# Patient Record
Sex: Female | Born: 1970 | Race: White | Hispanic: No | Marital: Single | Smoking: Former smoker
Health system: Southern US, Community
[De-identification: ages and names within clinical notes are randomized; demographics above are authoritative.]

## PROBLEM LIST (undated history)

## (undated) DIAGNOSIS — I429 Cardiomyopathy, unspecified: Secondary | ICD-10-CM

## (undated) DIAGNOSIS — G43909 Migraine, unspecified, not intractable, without status migrainosus: Secondary | ICD-10-CM

---

## 2000-03-18 ENCOUNTER — Other Ambulatory Visit: Admission: RE | Admit: 2000-03-18 | Discharge: 2000-03-18 | Payer: Self-pay | Admitting: Obstetrics & Gynecology

## 2000-09-07 ENCOUNTER — Inpatient Hospital Stay (HOSPITAL_COMMUNITY): Admission: AD | Admit: 2000-09-07 | Discharge: 2000-09-07 | Payer: Self-pay | Admitting: Obstetrics & Gynecology

## 2000-09-07 ENCOUNTER — Encounter: Payer: Self-pay | Admitting: Obstetrics and Gynecology

## 2000-11-15 ENCOUNTER — Inpatient Hospital Stay (HOSPITAL_COMMUNITY): Admission: AD | Admit: 2000-11-15 | Discharge: 2000-11-18 | Payer: Self-pay | Admitting: Obstetrics & Gynecology

## 2000-11-19 ENCOUNTER — Encounter: Admission: RE | Admit: 2000-11-19 | Discharge: 2000-11-29 | Payer: Self-pay | Admitting: Obstetrics & Gynecology

## 2001-04-06 ENCOUNTER — Encounter: Payer: Self-pay | Admitting: Family Medicine

## 2001-04-06 ENCOUNTER — Encounter: Admission: RE | Admit: 2001-04-06 | Discharge: 2001-04-06 | Payer: Self-pay | Admitting: Family Medicine

## 2002-07-05 ENCOUNTER — Other Ambulatory Visit: Admission: RE | Admit: 2002-07-05 | Discharge: 2002-07-05 | Payer: Self-pay | Admitting: Obstetrics & Gynecology

## 2003-08-22 ENCOUNTER — Other Ambulatory Visit: Admission: RE | Admit: 2003-08-22 | Discharge: 2003-08-22 | Payer: Self-pay | Admitting: Obstetrics & Gynecology

## 2004-10-03 ENCOUNTER — Other Ambulatory Visit: Admission: RE | Admit: 2004-10-03 | Discharge: 2004-10-03 | Payer: Self-pay | Admitting: Obstetrics & Gynecology

## 2005-12-09 ENCOUNTER — Other Ambulatory Visit: Admission: RE | Admit: 2005-12-09 | Discharge: 2005-12-09 | Payer: Self-pay | Admitting: Obstetrics and Gynecology

## 2007-01-16 ENCOUNTER — Emergency Department (HOSPITAL_COMMUNITY): Admission: EM | Admit: 2007-01-16 | Discharge: 2007-01-16 | Payer: Self-pay | Admitting: Emergency Medicine

## 2012-05-31 ENCOUNTER — Ambulatory Visit: Payer: Self-pay | Admitting: Neurology

## 2012-11-29 ENCOUNTER — Emergency Department (HOSPITAL_COMMUNITY): Payer: Medicaid Other

## 2012-11-29 ENCOUNTER — Emergency Department (HOSPITAL_COMMUNITY)
Admission: EM | Admit: 2012-11-29 | Discharge: 2012-11-29 | Disposition: A | Discharge status: Home / Self Care | Payer: Medicaid Other | Attending: Emergency Medicine | Admitting: Emergency Medicine

## 2012-11-29 ENCOUNTER — Encounter (HOSPITAL_COMMUNITY): Payer: Self-pay | Admitting: *Deleted

## 2012-11-29 DIAGNOSIS — R112 Nausea with vomiting, unspecified: Secondary | ICD-10-CM | POA: Insufficient documentation

## 2012-11-29 DIAGNOSIS — N200 Calculus of kidney: Secondary | ICD-10-CM | POA: Insufficient documentation

## 2012-11-29 DIAGNOSIS — G43909 Migraine, unspecified, not intractable, without status migrainosus: Secondary | ICD-10-CM | POA: Insufficient documentation

## 2012-11-29 DIAGNOSIS — Z79899 Other long term (current) drug therapy: Secondary | ICD-10-CM | POA: Insufficient documentation

## 2012-11-29 DIAGNOSIS — Z3202 Encounter for pregnancy test, result negative: Secondary | ICD-10-CM | POA: Insufficient documentation

## 2012-11-29 DIAGNOSIS — R1032 Left lower quadrant pain: Secondary | ICD-10-CM | POA: Insufficient documentation

## 2012-11-29 DIAGNOSIS — R109 Unspecified abdominal pain: Secondary | ICD-10-CM

## 2012-11-29 HISTORY — DX: Migraine, unspecified, not intractable, without status migrainosus: G43.909

## 2012-11-29 LAB — BASIC METABOLIC PANEL
BUN: 11 mg/dL (ref 6–23)
CO2: 21 mEq/L (ref 19–32)
Chloride: 103 mEq/L (ref 96–112)
GFR calc Af Amer: >90 mL/min (ref >90)
Potassium: 4 mEq/L (ref 3.5–5.1)

## 2012-11-29 LAB — CBC WITH DIFFERENTIAL/PLATELET
Basophils Relative: 0 % (ref 0–1)
HCT: 44.1 % (ref 36.0–46.0)
Hemoglobin: 15.5 g/dL — ABNORMAL HIGH (ref 12.0–15.0)
Lymphocytes Relative: 12 % (ref 12–46)
Lymphs Abs: 2 10*3/uL (ref 0.7–4.0)
MCHC: 35.1 g/dL (ref 30.0–36.0)
Monocytes Absolute: 0.8 10*3/uL (ref 0.1–1.0)
Monocytes Relative: 5 % (ref 3–12)
Neutro Abs: 13 10*3/uL — ABNORMAL HIGH (ref 1.7–7.7)
Neutrophils Relative %: 81 % — ABNORMAL HIGH (ref 43–77)
RBC: 5.13 MIL/uL — ABNORMAL HIGH (ref 3.87–5.11)
WBC: 16.1 10*3/uL — ABNORMAL HIGH (ref 4.0–10.5)

## 2012-11-29 LAB — URINALYSIS, ROUTINE W REFLEX MICROSCOPIC
Bilirubin Urine: NEGATIVE
Glucose, UA: NEGATIVE mg/dL
Leukocytes, UA: NEGATIVE
Nitrite: NEGATIVE
Specific Gravity, Urine: 1.015 (ref 1.005–1.030)
pH: 5 (ref 5.0–8.0)

## 2012-11-29 LAB — URINE MICROSCOPIC-ADD ON

## 2012-11-29 MED ORDER — KETOROLAC TROMETHAMINE 30 MG/ML IJ SOLN
30.0000 mg | Freq: Once | INTRAMUSCULAR | Status: AC
  Administered 2012-11-29: 30 mg via INTRAVENOUS
  Filled 2012-11-29: qty 1

## 2012-11-29 MED ORDER — IOHEXOL 300 MG/ML  SOLN
100.0000 mL | Freq: Once | INTRAMUSCULAR | Status: AC | PRN
  Administered 2012-11-29: 100 mL via INTRAVENOUS

## 2012-11-29 MED ORDER — SODIUM CHLORIDE 0.9 % IV BOLUS (SEPSIS)
1000.0000 mL | Freq: Once | INTRAVENOUS | Status: AC
  Administered 2012-11-29: 1000 mL via INTRAVENOUS

## 2012-11-29 MED ORDER — MORPHINE SULFATE 4 MG/ML IJ SOLN
8.0000 mg | Freq: Once | INTRAMUSCULAR | Status: AC
  Administered 2012-11-29: 8 mg via INTRAVENOUS
  Filled 2012-11-29: qty 2

## 2012-11-29 MED ORDER — ONDANSETRON 8 MG PO TBDP: 8.0000 mg | ORAL_TABLET | Freq: Three times a day (TID) | ORAL | Status: DC | PRN

## 2012-11-29 MED ORDER — OXYCODONE-ACETAMINOPHEN 5-325 MG PO TABS: 1.0000 | ORAL_TABLET | ORAL | Status: DC | PRN

## 2012-11-29 MED ORDER — IBUPROFEN 800 MG PO TABS: 800.0000 mg | ORAL_TABLET | Freq: Three times a day (TID) | ORAL | Status: DC

## 2012-11-29 MED ORDER — TAMSULOSIN HCL 0.4 MG PO CAPS: 0.4000 mg | ORAL_CAPSULE | Freq: Every day | ORAL | Status: DC

## 2012-11-29 MED ORDER — ONDANSETRON HCL 4 MG/2ML IJ SOLN
4.0000 mg | Freq: Once | INTRAMUSCULAR | Status: AC
  Administered 2012-11-29: 4 mg via INTRAVENOUS
  Filled 2012-11-29: qty 2

## 2012-11-29 MED ORDER — TAMSULOSIN HCL 0.4 MG PO CAPS
0.4000 mg | ORAL_CAPSULE | Freq: Once | ORAL | Status: AC
  Administered 2012-11-29: 0.4 mg via ORAL
  Filled 2012-11-29: qty 1

## 2012-11-29 NOTE — ED Provider Notes (Signed)
Pt signed out to me by PA Paz. Pt with left sided abdominal pain that began at around 10pm. States gradually worsened to unbearable. Pt states she had similar pain few days ago, and it improved. Pt was evaluated by PA Paz, and CT abd/pelvis ordered for further evaluation.   Results for orders placed during the hospital encounter of 11/29/12  CBC WITH DIFFERENTIAL      Component Value Range   WBC 16.1 (*) 4.0 - 10.5 K/uL   RBC 5.13 (*) 3.87 - 5.11 MIL/uL   Hemoglobin 15.5 (*) 12.0 - 15.0 g/dL   HCT 29.5  62.1 - 30.8 %   MCV 86.0  78.0 - 100.0 fL   MCH 30.2  26.0 - 34.0 pg   MCHC 35.1  30.0 - 36.0 g/dL   RDW 65.7  84.6 - 96.2 %   Platelets 454 (*) 150 - 400 K/uL   Neutrophils Relative 81 (*) 43 - 77 %   Neutro Abs 13.0 (*) 1.7 - 7.7 K/uL   Lymphocytes Relative 12  12 - 46 %   Lymphs Abs 2.0  0.7 - 4.0 K/uL   Monocytes Relative 5  3 - 12 %   Monocytes Absolute 0.8  0.1 - 1.0 K/uL   Eosinophils Relative 1  0 - 5 %   Eosinophils Absolute 0.2  0.0 - 0.7 K/uL   Basophils Relative 0  0 - 1 %   Basophils Absolute 0.1  0.0 - 0.1 K/uL  BASIC METABOLIC PANEL      Component Value Range   Sodium 138  135 - 145 mEq/L   Potassium 4.0  3.5 - 5.1 mEq/L   Chloride 103  96 - 112 mEq/L   CO2 21  19 - 32 mEq/L   Glucose, Bld 138 (*) 70 - 99 mg/dL   BUN 11  6 - 23 mg/dL   Creatinine, Ser 9.52  0.50 - 1.10 mg/dL   Calcium 9.6  8.4 - 84.1 mg/dL   GFR calc non Af Amer >90  >90 mL/min   GFR calc Af Amer >90  >90 mL/min  URINALYSIS, ROUTINE W REFLEX MICROSCOPIC      Component Value Range   Color, Urine YELLOW  YELLOW   APPearance CLOUDY (*) CLEAR   Specific Gravity, Urine 1.015  1.005 - 1.030   pH 5.0  5.0 - 8.0   Glucose, UA NEGATIVE  NEGATIVE mg/dL   Hgb urine dipstick SMALL (*) NEGATIVE   Bilirubin Urine NEGATIVE  NEGATIVE   Ketones, ur TRACE (*) NEGATIVE mg/dL   Protein, ur NEGATIVE  NEGATIVE mg/dL   Urobilinogen, UA 0.2  0.0 - 1.0 mg/dL   Nitrite NEGATIVE  NEGATIVE   Leukocytes, UA NEGATIVE   NEGATIVE  PREGNANCY, URINE      Component Value Range   Preg Test, Ur NEGATIVE  NEGATIVE  URINE MICROSCOPIC-ADD ON      Component Value Range   Squamous Epithelial / LPF FEW (*) RARE   WBC, UA 0-2  <3 WBC/hpf   RBC / HPF 0-2  <3 RBC/hpf   Bacteria, UA FEW (*) RARE   Ct Abdomen Pelvis W Contrast  11/29/2012  *RADIOLOGY REPORT*  Clinical Data: Left lower quadrant pain.  Nausea and vomiting. White cell count 16.1.  CT ABDOMEN AND PELVIS WITH CONTRAST  Technique:  Multidetector CT imaging of the abdomen and pelvis was performed following the standard protocol during bolus administration of intravenous contrast.  Contrast: OMNIPAQUE IOHEXOL 300 MG/ML  SOLN  Comparison: None.  Findings: Mild dependent atelectasis in the lung bases.  Small esophageal hiatal hernia.  Cholelithiasis without gallbladder wall thickening.  The liver, spleen, pancreas, adrenal glands, abdominal aorta, and retroperitoneal lymph nodes are unremarkable.  There is left-sided pyelocaliectasis and ureterectasis down to the level of the bladder.  There is suggestion of a punctate sized stone in the distal left ureter at the ureterovesicle junction.  The size of the stone is somewhat out of proportion to the degree of obstructive change.  An occult radiopaque stones or soft tissue lesion are not entirely excluded.  Consider retrograde pyelography if clinically indicated.  The right ureter and collecting system are decompressed.  No solid renal mass lesions identified.  The stomach, small bowel, and colon are not abnormally distended. No free air or free fluid in the abdomen.  Pelvis:  Bladder wall is not thickened.  Intrauterine device present.  Uterus and ovaries are not enlarged.  No free or loculated pelvic fluid collections.  The appendix is normal.  No diverticulitis.  Normal alignment of the lumbar spine.  No significant pelvic lymphadenopathy.  IMPRESSION: Distal left ureteral obstruction, probably by a punctate sized stone  seen in the left ureterovesicle junction.   Original Report Authenticated By: Burman Nieves, M.D.     7:11 AM CT showing left sided punctate sized stone in the left distal ureter at UV junction. pts pain is 3/10 now. Will try toradol. Will d/c home with pain medications, anti memetics, flomax. Follow up with urology. instructed to return if pain worsening, fever, any other concerning symptoms    Lottie Mussel, [place] 11/29/12 [phone]

## 2012-11-29 NOTE — ED Provider Notes (Signed)
Medical screening examination/treatment/procedure(s) were performed by non-physician practitioner and as supervising physician I was immediately available for consultation/collaboration.  Olivia Mackie, MD 11/29/12 [phone]

## 2012-11-29 NOTE — ED Provider Notes (Signed)
History     CSN: [id number]  Arrival date & time 11/29/12  1191   First MD Initiated Contact with Patient 11/29/12 0410      Chief Complaint  Patient presents with  . Abdominal Pain    (Consider location/radiation/quality/duration/timing/severity/associated sxs/prior treatment) HPI Comments: Krystal Gregory is a 42 y.o. female w hx of migraines presents to the ER c/o LLQ abd pain. Onset of pain began last Friday and was associated with emesis. Pain at that time was a dull ache and pt assumed it to be d/t a stomach virus. Then today pt developed an acute onset of significant LLQ abdominal pain, at 10 pm associated w hyper emesis. Severity 10/10. Described as sharp stabbing pain. Symptoms are moderate. Pt denies fevers, change in BM, urinary symptoms, vaginal dc,or past abdominal surgery. Pt states that she has an IUD and is currently on her mestural cycle   The history is provided by the patient.    Past Medical History  Diagnosis Date  . Migraines     History reviewed. No pertinent past surgical history.  No family history on file.  History  Substance Use Topics  . Smoking status: Never Smoker   . Smokeless tobacco: Not on file  . Alcohol Use: No    OB History    Grav Para Term Preterm Abortions TAB SAB Ect Mult Living                  Review of Systems  Constitutional: Negative for fever, chills and appetite change.  HENT: Negative for congestion.   Eyes: Negative for visual disturbance.  Respiratory: Negative for shortness of breath.   Cardiovascular: Negative for chest pain and leg swelling.  Gastrointestinal: Positive for nausea, vomiting and abdominal pain. Negative for diarrhea, constipation, blood in stool, abdominal distention, anal bleeding and rectal pain.  Genitourinary: Negative for dysuria, urgency and frequency.  Skin: Negative for rash.  Neurological: Negative for dizziness, syncope, weakness, light-headedness, numbness and headaches.    Psychiatric/Behavioral: Negative for confusion.  All other systems reviewed and are negative.    Allergies  Review of patient's allergies indicates no known allergies.  Home Medications   Current Outpatient Rx  Name  Route  Sig  Dispense  Refill  . DIPHENHYDRAMINE HCL 25 MG PO CAPS   Oral   Take 100 mg by mouth every 6 (six) hours as needed. Sleep and congestion         . PROMETHAZINE HCL 25 MG PO TABS   Oral   Take 25 mg by mouth every 6 (six) hours as needed. sleep         . SUMATRIPTAN SUCCINATE 100 MG PO TABS   Oral   Take 100 mg by mouth every 2 (two) hours as needed. Migraines         . TOPIRAMATE 50 MG PO TABS   Oral   Take 25 mg by mouth 2 (two) times daily.           BP 155/97  Pulse 114  Temp 98 F (36.7 C)  Resp 20  SpO2 98%  Physical Exam  Nursing note and vitals reviewed. Constitutional: She is oriented to person, place, and time. She appears well-developed and well-nourished. No distress.  HENT:  Head: Normocephalic and atraumatic.  Eyes: Conjunctivae normal and EOM are normal.  Neck: Normal range of motion.  Cardiovascular:       Tachycardia, intact distal pulses  Pulmonary/Chest: Effort normal.  Abdominal:  Obese abdomen. Bowel sounds present. ttp at LLQ. No masses, rebound, or gaurding  Musculoskeletal: Normal range of motion.  Neurological: She is alert and oriented to person, place, and time.  Skin: Skin is warm and dry. No rash noted. She is not diaphoretic.  Psychiatric: She has a normal mood and affect. Her behavior is normal.    ED Course  Procedures (including critical care time)  Labs Reviewed  CBC WITH DIFFERENTIAL - Abnormal; Notable for the following:    WBC 16.1 (*)     RBC 5.13 (*)     Hemoglobin 15.5 (*)     Platelets 454 (*)     Neutrophils Relative 81 (*)     Neutro Abs 13.0 (*)     All other components within normal limits  BASIC METABOLIC PANEL - Abnormal; Notable for the following:    Glucose, Bld  138 (*)     All other components within normal limits  URINALYSIS, ROUTINE W REFLEX MICROSCOPIC  PREGNANCY, URINE   No results found.   No diagnosis found.   BP 155/97  Pulse 114  Temp 98 F (36.7 C)  Resp 20  SpO2 98%   MDM  LLQ abdominal pain  Pt to ER c/o LLQ abd pain and emesis onset 10 pm. Pt care resumed by [facility] who will plan disposition pending results. CT, UA, and preg pending. IVF, pain meds and antiemetics given. If no etiology for pain seen on CT pelvic recommended for possible ovarian etiology.         Jaci Carrel, [place] 11/29/12 [phone]

## 2012-11-29 NOTE — ED Provider Notes (Signed)
Medical screening examination/treatment/procedure(s) were performed by non-physician practitioner and as supervising physician I was immediately available for consultation/collaboration.  Olivia Mackie, MD 11/29/12 [phone]

## 2012-11-29 NOTE — ED Notes (Signed)
Pt alert and oriented x4. Respirations even and unlabored, bilateral symmetrical rise and fall of chest. Skin warm and dry. In no acute distress. Denies needs.   

## 2012-11-29 NOTE — ED Notes (Signed)
Pt developed left lower abd pain since 10pm; nausea/vomiting; no diarrhea

## 2012-11-29 NOTE — ED Notes (Signed)
Pt escorted to discharge window. Pt verbalized understanding discharge instructions. In no acute distress.  

## 2012-11-29 NOTE — ED Notes (Signed)
CT notified pt completed CM 

## 2012-11-30 LAB — URINE CULTURE

## 2014-03-21 ENCOUNTER — Other Ambulatory Visit: Payer: Self-pay | Admitting: Obstetrics & Gynecology

## 2014-03-21 DIAGNOSIS — R928 Other abnormal and inconclusive findings on diagnostic imaging of breast: Secondary | ICD-10-CM

## 2014-04-06 ENCOUNTER — Other Ambulatory Visit: Payer: Self-pay | Admitting: Obstetrics & Gynecology

## 2014-04-06 ENCOUNTER — Ambulatory Visit
Admission: RE | Admit: 2014-04-06 | Discharge: 2014-04-06 | Disposition: A | Discharge status: Home / Self Care | Payer: Medicaid Other | Source: Ambulatory Visit | Attending: Obstetrics & Gynecology | Admitting: Obstetrics & Gynecology

## 2014-04-06 DIAGNOSIS — R928 Other abnormal and inconclusive findings on diagnostic imaging of breast: Secondary | ICD-10-CM

## 2014-04-12 ENCOUNTER — Inpatient Hospital Stay: Admission: RE | Admit: 2014-04-12 | Payer: Medicaid Other | Source: Ambulatory Visit

## 2014-04-19 ENCOUNTER — Inpatient Hospital Stay: Admission: RE | Admit: 2014-04-19 | Payer: Medicaid Other | Source: Ambulatory Visit

## 2014-04-30 ENCOUNTER — Ambulatory Visit
Admission: RE | Admit: 2014-04-30 | Discharge: 2014-04-30 | Disposition: A | Discharge status: Home / Self Care | Payer: Medicaid Other | Source: Ambulatory Visit | Attending: Obstetrics & Gynecology | Admitting: Obstetrics & Gynecology

## 2014-04-30 DIAGNOSIS — R928 Other abnormal and inconclusive findings on diagnostic imaging of breast: Secondary | ICD-10-CM

## 2014-05-15 ENCOUNTER — Other Ambulatory Visit (INDEPENDENT_AMBULATORY_CARE_PROVIDER_SITE_OTHER): Payer: Self-pay | Admitting: *Deleted

## 2014-05-15 ENCOUNTER — Encounter (INDEPENDENT_AMBULATORY_CARE_PROVIDER_SITE_OTHER): Payer: Self-pay | Admitting: General Surgery

## 2014-05-15 ENCOUNTER — Ambulatory Visit (INDEPENDENT_AMBULATORY_CARE_PROVIDER_SITE_OTHER): Payer: Medicaid Other | Admitting: General Surgery

## 2014-05-15 VITALS — BP 132/84 | HR 107 | Temp 98.0°F | Ht 67.0 in | Wt 197.0 lb

## 2014-05-15 DIAGNOSIS — D0590 Unspecified type of carcinoma in situ of unspecified breast: Secondary | ICD-10-CM

## 2014-05-15 DIAGNOSIS — D0591 Unspecified type of carcinoma in situ of right breast: Secondary | ICD-10-CM

## 2014-05-15 DIAGNOSIS — D0501 Lobular carcinoma in situ of right breast: Secondary | ICD-10-CM | POA: Insufficient documentation

## 2014-05-15 NOTE — Patient Instructions (Signed)
Will get MRI and follow up in 6 months

## 2014-05-16 ENCOUNTER — Telehealth (INDEPENDENT_AMBULATORY_CARE_PROVIDER_SITE_OTHER): Payer: Self-pay | Admitting: *Deleted

## 2014-05-16 NOTE — Telephone Encounter (Signed)
Called pt to inform her of her appt for MR Breast.  I advised pt that since Dr. Marlou Starks wanted Bilateral MRI that the [facility] required that she have a Bilateral Mammogram.  Pt got very upset with me stating that what we was doing to her was wrong.  She stated that it was just insurance fraud.  I explained to the pt that the Mammogram was beyond our control that that was a requirement of the Breast Center.  Pt stated that she knew that I wasn't in charge but it was still wrong of [place]. She then proceeded to tell me that she was just going to go and she hung up on me.  I got advice from Carlene Coria, Dr. Ethlyn Gallery assistant, she advised me to cancel the appts and make documentation.  That's what I have done.  The referrals are still open in pt's chart if she decides to proceed, then we will schedule at that time.  Anderson Malta

## 2014-05-28 NOTE — Progress Notes (Signed)
Patient ID: Krystal Gregory, female   DOB: 04-Feb-1971, 43 y.o.   MRN: [id number]  Chief Complaint  Patient presents with  . eval right breast    HPI Krystal Gregory is a 43 y.o. female.  We're asked to see the patient in consultation by Dr. Stann Mainland to evaluate her for LCIS of the right breast. The patient is a 43 year old white female who recently went for a routine screening mammogram. At that time she was found to have a 3 mm cluster of calcifications that were abnormal in the upper right breast. 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 She denies any breast pain. She denies any discharge from her nipple. She does note that her mother had ovarian cancer. She also notes that she has had multiple medical problems which she attributes to the water at [facility].  HPI  Past Medical History  Diagnosis Date  . Migraines     History reviewed. No pertinent past surgical history.  History reviewed. No pertinent family history.  Social History History  Substance Use Topics  . Smoking status: Former Smoker    Types: Cigarettes    Quit date: 05/16/1999  . Smokeless tobacco: Not on file  . Alcohol Use: Yes    No Known Allergies  Current Outpatient Prescriptions  Medication Sig Dispense Refill  . diphenhydrAMINE (BENADRYL) 25 mg capsule Take 100 mg by mouth every 6 (six) hours as needed. Sleep and congestion      . ibuprofen (ADVIL,MOTRIN) 800 MG tablet Take 1 tablet (800 mg total) by mouth 3 (three) times daily.  21 tablet  0  . SUMAtriptan (IMITREX) 100 MG tablet Take 100 mg by mouth every 2 (two) hours as needed. Migraines       No current facility-administered medications for this visit.    Review of Systems Review of Systems  Constitutional: Negative.   HENT: Negative.   Eyes: Negative.   Respiratory: Negative.   Cardiovascular: Negative.   Gastrointestinal: Negative.   Endocrine: Negative.   Genitourinary: Negative.   Musculoskeletal: Negative.   Skin:  Negative.   Allergic/Immunologic: Negative.   Neurological: Negative.   Hematological: Negative.   Psychiatric/Behavioral: Negative.     Blood pressure 132/84, pulse 107, temperature 98 F (36.7 C), height 5\' 7"  (1.702 m), weight 197 lb (89.359 kg).  Physical Exam Physical Exam  Constitutional: She is oriented to person, place, and time. She appears well-developed and well-nourished.  HENT:  Head: Normocephalic and atraumatic.  Eyes: Conjunctivae and EOM are normal. Pupils are equal, round, and reactive to light.  Neck: Normal range of motion. Neck supple.  Cardiovascular: Normal rate, regular rhythm and normal heart sounds.   Pulmonary/Chest: Effort normal and breath sounds normal.  There is symmetric diffuse nodular breast tissue but no discrete mass in either breast. There is no palpable axillary, supraclavicular, or cervical lymphadenopathy.  Abdominal: Soft. Bowel sounds are normal.  Musculoskeletal: Normal range of motion.  Lymphadenopathy:    She has no cervical adenopathy.  Neurological: She is alert and oriented to person, place, and time.  Skin: Skin is warm and dry.  Psychiatric: She has a normal mood and affect. Her behavior is normal.    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 I've discussed with her that this is a risk factor for breast cancer which could occur in either breast and could be ductal or lobular. My  recommendation would be to take her to the operating room to do a lumpectomy to this area to make sure that we are not missing anything. I've discussed with her in detail the risks and benefits of the operation to do this as well as some of the technical aspects and she understands. At this point she refuses surgery. She feels as though it was all removed by the biopsy and that she will be able to tell if anything comes back.     Plan    At this point I would recommend that she come  back in 6 months for followup exam and repeat imaging.        TOTH III,Fabien Travelstead S 05/28/2014, 11:01 AM

## 2015-03-18 ENCOUNTER — Other Ambulatory Visit: Payer: Self-pay | Admitting: Obstetrics & Gynecology

## 2015-03-18 DIAGNOSIS — D0591 Unspecified type of carcinoma in situ of right breast: Secondary | ICD-10-CM

## 2015-03-20 ENCOUNTER — Ambulatory Visit
Admission: RE | Admit: 2015-03-20 | Discharge: 2015-03-20 | Disposition: A | Discharge status: Home / Self Care | Payer: Medicaid Other | Source: Ambulatory Visit | Attending: Obstetrics & Gynecology | Admitting: Obstetrics & Gynecology

## 2015-03-20 DIAGNOSIS — D0591 Unspecified type of carcinoma in situ of right breast: Secondary | ICD-10-CM

## 2017-11-26 ENCOUNTER — Other Ambulatory Visit: Payer: Self-pay

## 2017-11-26 ENCOUNTER — Emergency Department (HOSPITAL_COMMUNITY)
Admission: EM | Admit: 2017-11-26 | Discharge: 2017-11-26 | Disposition: A | Discharge status: Home / Self Care | Payer: BLUE CROSS/BLUE SHIELD | Attending: Emergency Medicine | Admitting: Emergency Medicine

## 2017-11-26 ENCOUNTER — Encounter (HOSPITAL_COMMUNITY): Payer: Self-pay

## 2017-11-26 DIAGNOSIS — Z79899 Other long term (current) drug therapy: Secondary | ICD-10-CM | POA: Insufficient documentation

## 2017-11-26 DIAGNOSIS — Z87891 Personal history of nicotine dependence: Secondary | ICD-10-CM | POA: Insufficient documentation

## 2017-11-26 DIAGNOSIS — T7840XA Allergy, unspecified, initial encounter: Secondary | ICD-10-CM | POA: Diagnosis present

## 2017-11-26 MED ORDER — METHYLPREDNISOLONE SODIUM SUCC 125 MG IJ SOLR
125.0000 mg | Freq: Once | INTRAMUSCULAR | Status: AC
  Administered 2017-11-26: 125 mg via INTRAVENOUS
  Filled 2017-11-26: qty 2

## 2017-11-26 MED ORDER — DIPHENHYDRAMINE HCL 50 MG/ML IJ SOLN
25.0000 mg | Freq: Once | INTRAMUSCULAR | Status: AC
Start: 2017-11-26 — End: 2017-11-26
  Administered 2017-11-26: 25 mg via INTRAVENOUS
  Filled 2017-11-26: qty 1

## 2017-11-26 MED ORDER — FAMOTIDINE IN NACL 20-0.9 MG/50ML-% IV SOLN
20.0000 mg | Freq: Once | INTRAVENOUS | Status: AC
  Administered 2017-11-26: 20 mg via INTRAVENOUS
  Filled 2017-11-26: qty 50

## 2017-11-26 NOTE — ED Triage Notes (Signed)
Pt from home for allergic reaction that started around 1200. Pt states she does not have any previous allergies and has not taken anything new in the last 24 hours. Pt states she was on penicillin for an abscess in her tooth. Pt states she stopped taking the medication once she felt better, and when consulting with her provider, he told her to finish the course. Pt states " I don't like taking medicine, so I stopped". Pt endorses "throat tingling" that started a couple of hours ago. Pt A+Ox4 w/ no other c/o.

## 2017-11-26 NOTE — ED Provider Notes (Signed)
[place] [facility] DEPART[facility] Provider Note   CSN: [id number] Arrival date & time: 11/26/17  1312     History   Chief Complaint No chief complaint on file.   HPI Krystal Gregory is a 47 y.o. female.  Patient c/o suspected allergic reaction with diffuse body rash, itchy/burny feeling to skin, feels throat is swelling. Symptoms onset this AM. Had been taking pcn for the past couple days. No other new meds or change in meds. Ate seafood last night, no known new food or food allergen exposure. Denies throat pain or uri symptoms. No fever or chills. No hx anaphylactic rxn or severe allergic rxn in past.    The history is provided by the patient.    Past Medical History:  Diagnosis Date  . Migraines     Patient Active Problem List   Diagnosis Date Noted  . Neoplasm of right breast, primary tumor staging category Tis: lobular carcinoma in situ (LCIS) 05/15/2014    No past surgical history on file.  OB History    No data available       Home Medications    Prior to Admission medications   Medication Sig Start Date End Date Taking? Authorizing Provider  diphenhydrAMINE (BENADRYL) 25 mg capsule Take 100 mg by mouth every 6 (six) hours as needed. Sleep and congestion    [provider]  ibuprofen (ADVIL,MOTRIN) 800 MG tablet Take 1 tablet (800 mg total) by mouth 3 (three) times daily. 11/29/12   Kirichenko, Lahoma Rocker, PA-C  SUMAtriptan (IMITREX) 100 MG tablet Take 100 mg by mouth every 2 (two) hours as needed. Migraines    [provider]    Family History No family history on file.  Social History Social History   Tobacco Use  . Smoking status: Former Smoker    Types: Cigarettes    Last attempt to quit: 05/16/1999    Years since quitting: 18.5  Substance Use Topics  . Alcohol use: Yes  . Drug use: No     Allergies   Patient has no known allergies.   Review of Systems Review of Systems  Constitutional: Negative for fever.    HENT: Negative for sore throat.   Eyes: Negative for redness.  Respiratory: Negative for shortness of breath.   Cardiovascular: Negative for chest pain.  Gastrointestinal: Negative for vomiting.  Genitourinary: Negative for flank pain.  Musculoskeletal: Negative for back pain.  Skin: Positive for rash.  Neurological: Negative for headaches.  Hematological: Does not bruise/bleed easily.  Psychiatric/Behavioral: Negative for confusion.     Physical Exam Updated Vital Signs There were no vitals taken for this visit.  Physical Exam  Constitutional: She appears well-developed and well-nourished. No distress.  HENT:  Head: Atraumatic.  Mouth/Throat: Oropharynx is clear and moist.  Eyes: Conjunctivae are normal. No scleral icterus.  Neck: Neck supple. No tracheal deviation present. No thyromegaly present.  Cardiovascular: Normal rate, regular rhythm, normal heart sounds and intact distal pulses.  Pulmonary/Chest: Effort normal. No stridor. No respiratory distress.  Abdominal: Soft. Normal appearance. She exhibits no distension. There is no tenderness.  Musculoskeletal: She exhibits no edema.  Neurological: She is alert.  Skin: Skin is warm and dry. No rash noted. She is not diaphoretic.  Psychiatric: She has a normal mood and affect.  Nursing note and vitals reviewed.    ED Treatments / Results  Labs (all labs ordered are listed, but only abnormal results are displayed) Labs Reviewed - No data to display  EKG  EKG Interpretation None       Radiology No results found.  Procedures Procedures (including critical care time)  Medications Ordered in ED Medications  diphenhydrAMINE (BENADRYL) injection 25 mg (not administered)  famotidine (PEPCID) IVPB 20 mg premix (not administered)  methylPREDNISolone sodium succinate (SOLU-MEDROL) 125 mg/2 mL injection 125 mg (not administered)     Initial Impression / Assessment and Plan / ED Course  I have reviewed the triage  vital signs and the nursing notes.  Pertinent labs & imaging results that were available during my care of the patient were reviewed by me and considered in my medical decision making (see chart for details).  Iv ns. Benadryl iv. pepcid iv. Solumedrol iv.  Continuous pulse ox and monitor.   Reviewed nursing notes and prior charts for additional history.   Recheck, symptoms resolved.     Final Clinical Impressions(s) / ED Diagnoses   Final diagnoses:  None    ED Discharge Orders    None       Lajean Saver, MD 11/26/17 1528

## 2017-11-26 NOTE — Discharge Instructions (Signed)
It was our pleasure to provide your ER care today - we hope that you feel better.  Avoid taking penicillins and inform your doctors in future of possible allergic reaction.  Take benadryl 25-50 mg every 6 hours as need today - make make drowsy, no driving when taking.  Follow up with allergist in the next 1-2 weeks - call office to arrange appointment.  Return to ER if worse, symptoms recur, trouble breathing, unable to swallow, other concern.

## 2020-08-05 ENCOUNTER — Emergency Department
Admission: EM | Admit: 2020-08-05 | Discharge: 2020-08-07 | Disposition: A | Discharge status: Home / Self Care | Payer: BLUE CROSS/BLUE SHIELD | Attending: Emergency Medicine | Admitting: Emergency Medicine

## 2020-08-05 ENCOUNTER — Encounter: Payer: Self-pay | Admitting: Emergency Medicine

## 2020-08-05 DIAGNOSIS — F22 Delusional disorders: Secondary | ICD-10-CM | POA: Insufficient documentation

## 2020-08-05 DIAGNOSIS — F332 Major depressive disorder, recurrent severe without psychotic features: Secondary | ICD-10-CM

## 2020-08-05 DIAGNOSIS — C50911 Malignant neoplasm of unspecified site of right female breast: Secondary | ICD-10-CM | POA: Insufficient documentation

## 2020-08-05 DIAGNOSIS — Z20822 Contact with and (suspected) exposure to covid-19: Secondary | ICD-10-CM | POA: Insufficient documentation

## 2020-08-05 DIAGNOSIS — F250 Schizoaffective disorder, bipolar type: Secondary | ICD-10-CM | POA: Insufficient documentation

## 2020-08-05 DIAGNOSIS — Z87891 Personal history of nicotine dependence: Secondary | ICD-10-CM | POA: Insufficient documentation

## 2020-08-05 LAB — URINE DRUG SCREEN, QUALITATIVE (ARMC ONLY)
Amphetamines, Ur Screen: NOT DETECTED
Barbiturates, Ur Screen: NOT DETECTED
Benzodiazepine, Ur Scrn: NOT DETECTED
Cannabinoid 50 Ng, Ur ~~LOC~~: POSITIVE — AB
Cocaine Metabolite,Ur ~~LOC~~: NOT DETECTED
MDMA (Ecstasy)Ur Screen: NOT DETECTED
Methadone Scn, Ur: NOT DETECTED
Opiate, Ur Screen: NOT DETECTED
Phencyclidine (PCP) Ur S: NOT DETECTED
Tricyclic, Ur Screen: POSITIVE — AB

## 2020-08-05 LAB — COMPREHENSIVE METABOLIC PANEL
ALT: 26 U/L (ref 0–44)
AST: 28 U/L (ref 15–41)
Albumin: 4.9 g/dL (ref 3.5–5.0)
Alkaline Phosphatase: 74 U/L (ref 38–126)
Anion gap: 12 (ref 5–15)
BUN: 16 mg/dL (ref 6–20)
CO2: 24 mmol/L (ref 22–32)
Calcium: 9.8 mg/dL (ref 8.9–10.3)
Chloride: 103 mmol/L (ref 98–111)
Creatinine, Ser: 0.93 mg/dL (ref 0.44–1.00)
GFR calc Af Amer: >60 mL/min (ref >60)
GFR calc non Af Amer: >60 mL/min (ref >60)
Glucose, Bld: 169 mg/dL — ABNORMAL HIGH (ref 70–99)
Potassium: 3.8 mmol/L (ref 3.5–5.1)
Sodium: 139 mmol/L (ref 135–145)
Total Bilirubin: 1.3 mg/dL — ABNORMAL HIGH (ref 0.3–1.2)
Total Protein: 8.3 g/dL — ABNORMAL HIGH (ref 6.5–8.1)

## 2020-08-05 LAB — CBC
HCT: 43.7 % (ref 36.0–46.0)
Hemoglobin: 15.5 g/dL — ABNORMAL HIGH (ref 12.0–15.0)
MCH: 30.2 pg (ref 26.0–34.0)
MCHC: 35.5 g/dL (ref 30.0–36.0)
MCV: 85 fL (ref 80.0–100.0)
Platelets: 464 K/uL — ABNORMAL HIGH (ref 150–400)
RBC: 5.14 MIL/uL — ABNORMAL HIGH (ref 3.87–5.11)
RDW: 12.9 % (ref 11.5–15.5)
WBC: 12.3 K/uL — ABNORMAL HIGH (ref 4.0–10.5)
nRBC: 0 % (ref 0.0–0.2)

## 2020-08-05 LAB — SARS CORONAVIRUS 2 BY RT PCR (HOSPITAL ORDER, PERFORMED IN ~~LOC~~ HOSPITAL LAB): SARS Coronavirus 2: NEGATIVE

## 2020-08-05 LAB — SALICYLATE LEVEL: Salicylate Lvl: <7 mg/dL — ABNORMAL LOW (ref 7.0–30.0)

## 2020-08-05 LAB — ETHANOL: Alcohol, Ethyl (B): <10 mg/dL (ref <10)

## 2020-08-05 LAB — ACETAMINOPHEN LEVEL: Acetaminophen (Tylenol), Serum: <10 ug/mL — ABNORMAL LOW (ref 10–30)

## 2020-08-05 NOTE — ED Triage Notes (Signed)
Pt arrived via [employer] under IVC. Per affidavit, police at residence and witnessed pt with bizarre behavior. Pt in triage reports people are out to get her and that she is working with Elenore Rota trump devising a Animator. Pt is yelling in triage and is not cooperating. Pt remaining in forensic restraints. Mother, Gaige Fussner called to provide information including that pt has been mentally ill x8 years, diagnosed with BP and schizophrenia. Pt refuses to adhere to medication or therapy. Mother reports that pt has worsened over the last 3 months when she was informed that the mother was dying of terminal cancer. Pt continues to yell. Pt with active hallucinations, thoughts of grandeur, religious remarks with outburst.

## 2020-08-05 NOTE — ED Provider Notes (Signed)
[facility] Emergency Department Provider Note   ____________________________________________   First MD Initiated Contact with Patient 08/05/20 2159     (approximate)  I have reviewed the triage vital signs and the nursing notes.   HISTORY  Chief Complaint Mental Health Problem    HPI Krystal Gregory is a 49 y.o. female who, per IVC, has a past diagnosis of bipolar disorder and schizophrenia who presents under IVC by law enforcement for abnormal behavior.  When patient is asked why she is here she states "I am Elenore Rota Trump's replacement and we are going worldwide".  Patient has no active complaints at this time.  Patient only states that her medical history is migraines for which she takes Imitrex intermittently.  Patient refusing any further history or review of systems at this time         Past Medical History:  Diagnosis Date  . Migraines   . Migraines     Patient Active Problem List   Diagnosis Date Noted  . 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 No pertinent surgical history.  Prior to Admission medications   Medication Sig Start Date End Date Taking? Authorizing Provider  diphenhydrAMINE (BENADRYL) 25 mg capsule Take 100 mg by mouth every 6 (six) hours as needed. Sleep and congestion    [provider]  ibuprofen (ADVIL,MOTRIN) 800 MG tablet Take 1 tablet (800 mg total) by mouth 3 (three) times daily. 11/29/12   Kirichenko, Lahoma Rocker, PA-C  SUMAtriptan (IMITREX) 100 MG tablet Take 100 mg by mouth every 2 (two) hours as needed. Migraines    [provider]    Allergies Penicillins  History reviewed. No pertinent family history.  Social History Social History   Tobacco Use  . Smoking status: Former Smoker    Types: Cigarettes    Quit date: 05/16/1999    Years since quitting: 21.2  . Smokeless tobacco: Never Used  Substance Use  Topics  . Alcohol use: Yes  . Drug use: No    Review of Systems Unable to assess   ____________________________________________   PHYSICAL EXAM:  VITAL SIGNS: ED Triage Vitals [08/05/20 2046]  Enc Vitals Group     BP (!) 158/88     Pulse Rate (!) 59     Resp 18     Temp 98.7 F (37.1 C)     Temp Source Oral     SpO2 98 %     Weight      Height      Head Circumference      Peak Flow      Pain Score      Pain Loc      Pain Edu?      Excl. in [place]?    Constitutional: Alert and oriented. Well appearing and in no acute distress. Eyes: Conjunctivae are normal. PERRL. EOMI. Head: Atraumatic. Nose: No congestion/rhinnorhea. Mouth/Throat: Mucous membranes are moist. Neck: No stridor Cardiovascular: Normal rate, regular rhythm. Grossly normal heart sounds.  Good peripheral circulation. Respiratory: Normal respiratory effort.  No retractions. Gastrointestinal: Soft and nontender. No distention. Musculoskeletal: No lower extremity tenderness nor edema.  No joint effusions. Neurologic:  Normal speech and language. No gross focal neurologic deficits are appreciated. Skin:  Skin is warm and dry. No rash noted. Psychiatric: Agitated.  Speech is pressured.  Not responding to internal stimuli  ____________________________________________   LABS (all labs ordered are listed, but only  abnormal results are displayed)  Labs Reviewed  CBC - Abnormal; Notable for the following components:      Result Value   WBC 12.3 (*)    RBC 5.14 (*)    Hemoglobin 15.5 (*)    Platelets 464 (*)    All other components within normal limits  SARS CORONAVIRUS 2 BY RT PCR (HOSPITAL ORDER, [place] [facility])  COMPREHENSIVE METABOLIC PANEL  ETHANOL  SALICYLATE LEVEL  ACETAMINOPHEN LEVEL  URINE DRUG SCREEN, QUALITATIVE ([place])  POC URINE PREG, ED   __________________________________   PROCEDURES  Procedure(s) performed (including Critical  Care):  Procedures   ____________________________________________   INITIAL IMPRESSION / ASSESSMENT AND PLAN / ED COURSE        Patient presents under IVC for hallucinations/delusions. Thoughts are disorganized. No history of prior suicide attempt, and no SI or HI at this time. Clinically w/ no overt toxidrome, low suspicion for ingestion given hx and exam Thoughts unlikely 2/2 anemia, hypothyroidism, infection, or ICH. Patients decision making capacity is compromised and they are unable to perform all ADLs (additionally they are without appropriate caretakers to assist through this deficit).  Consult: Psychiatry to evaluate patient for grave disability Disposition: Pending psychiatric evaluation  Care of this patient will be signed out the oncoming physician.  All pertinent patient formation is conveyed and all questions answered.  All further care and disposition decisions will be made by the oncoming physician.      ____________________________________________   FINAL CLINICAL IMPRESSION(S) / ED DIAGNOSES  Final diagnoses:  Delusions [facility])     ED Discharge Orders    None       Note:  This document was prepared using Dragon voice recognition software and may include unintentional dictation errors.   Naaman Plummer, MD 08/05/20 [phone]
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
## 2020-08-06 DIAGNOSIS — F332 Major depressive disorder, recurrent severe without psychotic features: Secondary | ICD-10-CM | POA: Diagnosis present

## 2020-08-06 LAB — PREGNANCY, URINE: Preg Test, Ur: NEGATIVE

## 2020-08-06 MED ORDER — ZIPRASIDONE MESYLATE 20 MG IM SOLR
20.0000 mg | Freq: Once | INTRAMUSCULAR | Status: AC
  Administered 2020-08-06: 20 mg via INTRAMUSCULAR
  Filled 2020-08-06: qty 20

## 2020-08-06 MED ORDER — IBUPROFEN 400 MG PO TABS
400.0000 mg | ORAL_TABLET | Freq: Once | ORAL | Status: AC
  Administered 2020-08-06: 400 mg via ORAL
  Filled 2020-08-06: qty 1

## 2020-08-06 MED ORDER — ACETAMINOPHEN 325 MG PO TABS
650.0000 mg | ORAL_TABLET | Freq: Four times a day (QID) | ORAL | Status: DC | PRN
  Administered 2020-08-06 – 2020-08-07 (×2): 650 mg via ORAL
  Filled 2020-08-06 (×3): qty 2

## 2020-08-06 MED ORDER — LORAZEPAM 2 MG PO TABS
2.0000 mg | ORAL_TABLET | Freq: Once | ORAL | Status: AC
  Administered 2020-08-06: 2 mg via ORAL
  Filled 2020-08-06: qty 1

## 2020-08-06 MED ORDER — IBUPROFEN 800 MG PO TABS
400.0000 mg | ORAL_TABLET | Freq: Once | ORAL | Status: AC
  Administered 2020-08-06: 400 mg via ORAL

## 2020-08-06 MED ORDER — ACETAMINOPHEN 500 MG PO TABS
1000.0000 mg | ORAL_TABLET | Freq: Once | ORAL | Status: AC
  Administered 2020-08-06: 1000 mg via ORAL

## 2020-08-06 NOTE — BH Assessment (Signed)
Patient has been accepted to [facility].  Patient assigned to [facility] Accepting physician is Dr. Jonelle Sports.  Call report to [phone].  Representative was [employer].   ER Staff is aware of it:  [place], ER Secretary  Dr. Tamala Julian, ER MD  Maudie Mercury, Patient's Nurse     Pt can be transported after 9am; Report can be called after 8am.

## 2020-08-06 NOTE — ED Notes (Signed)
Patient wrote note to ex husband and asked for writer to call him. [place] [phone]. Writer attempted and left a HIPPA compliant message for return call.

## 2020-08-06 NOTE — BH Assessment (Signed)
Referral information for Psychiatric Hospitalization faxed to:   [facility] ([phone]- [phone]),    [facility] ([phone]---[phone]),   [place] [phone], [phone], [phone] or [phone]),    [facility] [phone]),    [place] [phone] -or- [phone]),    [facility] Camel [phone]).

## 2020-08-06 NOTE — ED Notes (Signed)
Spoke with Otho Perl, RN from [facility] and she stated they cant take patient due to her being in Forensic restraints and behavior, she asked that she be monitored for 24 hrs since the time she came in, and she will be able to come tomorrow 08/07/2020

## 2020-08-06 NOTE — ED Notes (Signed)
IVC  MOVING  TO  [facility] UNIT  PENDING  GOING TO  [place] IN THE  AM
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
## 2020-08-06 NOTE — ED Notes (Signed)
Writer receiving report from previous nurse and patient comes out of her room and twirling out of her room, into the quad and past the nurses station shouting "jubilee" Patient redirectable and escorted back to room by staff MD Quentin Cornwall notified

## 2020-08-06 NOTE — ED Notes (Signed)
Patient having delusional thoughts. Patient states she is the president of the world and Phineas Semen is about to happen. Patient asked for a pencil and paper. Patient was offered two crayons and patient refused due to color, Patient redirected to room by this Probation officer.

## 2020-08-06 NOTE — ED Notes (Signed)
Geodon IM given

## 2020-08-06 NOTE — ED Notes (Signed)
Patient coming to nurses station asking for tylenol. Writer advised she cannot have any at this time. Patient asking for ibuprofen. Patient fussing at this writer for not getting medication in timely manner and asking to use phone. Patient advised phone time was over at 7pm and it is currently after 9 pm.

## 2020-08-06 NOTE — ED Notes (Signed)
Patient comes out of her room hollering and yelling, Krystal Gregory is her sister and Krystal Gregory works for [employer], patient was not redirectable, she said its stupid for you all to have masks on, "We live in [place]" MD Quentin Cornwall heard the yelling, staff attempted to redirect patient she went to her room, still yelling and screaming

## 2020-08-06 NOTE — ED Notes (Signed)
Pt states she was supposed to go home and does not know why she is here. Pt then starts saying she will be the next president of the [place] and that we should discuss the state of the world economy.

## 2020-08-06 NOTE — ED Provider Notes (Signed)
Patient wandering into other patient's rooms causing increasing agitation.  She is unable to be redirected.  Will provide IM calming agent Geodon.   Merlyn Lot, MD 08/06/20 [phone]

## 2020-08-06 NOTE — ED Notes (Signed)
Patient coming out of room and demanding phone. Fussing at staff stating we better let her use phone. The govenerment is the reason for COVID and we need to talk with her doctor that works for [employer].

## 2020-08-06 NOTE — BH Assessment (Signed)
TTS spoke to [facility]) who confirmed pt's acceptance for tomorrow morning only if pt requires no restraints or emergency IM's.

## 2020-08-07 MED ORDER — LORAZEPAM 1 MG PO TABS
1.0000 mg | ORAL_TABLET | Freq: Four times a day (QID) | ORAL | Status: DC | PRN
  Administered 2020-08-07: 1 mg via ORAL
  Filled 2020-08-07: qty 1

## 2020-08-07 NOTE — ED Notes (Signed)
Attempted to give report 2x and told to call back because the nurse who is to receive report has not arrived on unit yet

## 2020-08-07 NOTE — ED Notes (Signed)
Pt denies SI/HI/AVH on assessment 

## 2020-08-07 NOTE — ED Provider Notes (Signed)
Emergency Medicine Observation Re-evaluation Note  Krystal Gregory is a 49 y.o. female, seen on rounds today.  Pt initially presented to the ED for complaints of Mental Health Problem Currently, the patient has no new complaints.  Physical Exam  BP (!) 143/90   Pulse 82   Temp 98.6 F (37 C)   Resp 18   SpO2 98%  Physical Exam General: nontoxic appearing, nad Cardiac: well perfused Lungs: even and unlabored respirations Psych: currently calm but will try to antagonize staff  ED Course / MDM  EKG:    I have reviewed the labs performed to date as well as medications administered while in observation.  Recent changes in the last 24 hours include escalating behavior requiring IM geodon and po ativan.  Plan  Current plan is for psych dispo. Patient is under full IVC at this time.   Merlyn Lot, MD 08/07/20 [phone]

## 2020-08-07 NOTE — ED Notes (Signed)
Pt being transferred to [facility]. Pt informed of transfer and required convincing to accept to go, initially was refusing to go. Escorted to door by TEFL teacher for transport to [facility]. Belongings given to officer to transport with pt.

## 2020-08-07 NOTE — ED Notes (Signed)
Patient redirected for going into room 1. Advised patient she was unable to sit in someone else's room, that when dayroom is open later this morning she could sit in day room and talk.

## 2020-11-16 DIAGNOSIS — U071 COVID-19: Secondary | ICD-10-CM

## 2020-11-16 HISTORY — DX: COVID-19: U07.1

## 2020-11-29 ENCOUNTER — Inpatient Hospital Stay (HOSPITAL_COMMUNITY)
Admit: 2020-11-29 | Discharge: 2020-11-29 | Disposition: A | Discharge status: Home / Self Care | Payer: HRSA Program | Attending: Pulmonary Disease | Admitting: Pulmonary Disease

## 2020-11-29 ENCOUNTER — Emergency Department: Payer: HRSA Program

## 2020-11-29 ENCOUNTER — Encounter: Payer: Self-pay | Admitting: Emergency Medicine

## 2020-11-29 ENCOUNTER — Inpatient Hospital Stay: Payer: HRSA Program

## 2020-11-29 ENCOUNTER — Inpatient Hospital Stay
Admission: EM | Admit: 2020-11-29 | Discharge: 2020-12-03 | DRG: 208 | Disposition: A | Discharge status: Home / Self Care | Payer: HRSA Program | Attending: Internal Medicine | Admitting: Internal Medicine

## 2020-11-29 ENCOUNTER — Other Ambulatory Visit: Payer: Self-pay

## 2020-11-29 DIAGNOSIS — Z880 Allergy status to penicillin: Secondary | ICD-10-CM

## 2020-11-29 DIAGNOSIS — U071 COVID-19: Secondary | ICD-10-CM | POA: Diagnosis present

## 2020-11-29 DIAGNOSIS — Z87891 Personal history of nicotine dependence: Secondary | ICD-10-CM

## 2020-11-29 DIAGNOSIS — I214 Non-ST elevation (NSTEMI) myocardial infarction: Secondary | ICD-10-CM | POA: Diagnosis not present

## 2020-11-29 DIAGNOSIS — Z79899 Other long term (current) drug therapy: Secondary | ICD-10-CM

## 2020-11-29 DIAGNOSIS — R197 Diarrhea, unspecified: Secondary | ICD-10-CM | POA: Diagnosis present

## 2020-11-29 DIAGNOSIS — I428 Other cardiomyopathies: Secondary | ICD-10-CM | POA: Diagnosis not present

## 2020-11-29 DIAGNOSIS — E1165 Type 2 diabetes mellitus with hyperglycemia: Secondary | ICD-10-CM | POA: Diagnosis present

## 2020-11-29 DIAGNOSIS — I110 Hypertensive heart disease with heart failure: Secondary | ICD-10-CM | POA: Diagnosis present

## 2020-11-29 DIAGNOSIS — J9601 Acute respiratory failure with hypoxia: Secondary | ICD-10-CM | POA: Diagnosis present

## 2020-11-29 DIAGNOSIS — I21A1 Myocardial infarction type 2: Secondary | ICD-10-CM | POA: Diagnosis present

## 2020-11-29 DIAGNOSIS — I959 Hypotension, unspecified: Secondary | ICD-10-CM | POA: Diagnosis present

## 2020-11-29 DIAGNOSIS — R0602 Shortness of breath: Secondary | ICD-10-CM | POA: Diagnosis not present

## 2020-11-29 DIAGNOSIS — I5021 Acute systolic (congestive) heart failure: Secondary | ICD-10-CM

## 2020-11-29 DIAGNOSIS — I409 Acute myocarditis, unspecified: Secondary | ICD-10-CM | POA: Diagnosis present

## 2020-11-29 DIAGNOSIS — J1282 Pneumonia due to coronavirus disease 2019: Secondary | ICD-10-CM | POA: Diagnosis not present

## 2020-11-29 DIAGNOSIS — E872 Acidosis: Secondary | ICD-10-CM | POA: Diagnosis present

## 2020-11-29 DIAGNOSIS — I429 Cardiomyopathy, unspecified: Secondary | ICD-10-CM

## 2020-11-29 DIAGNOSIS — R030 Elevated blood-pressure reading, without diagnosis of hypertension: Secondary | ICD-10-CM | POA: Diagnosis present

## 2020-11-29 DIAGNOSIS — R7989 Other specified abnormal findings of blood chemistry: Secondary | ICD-10-CM

## 2020-11-29 DIAGNOSIS — R739 Hyperglycemia, unspecified: Secondary | ICD-10-CM | POA: Diagnosis present

## 2020-11-29 DIAGNOSIS — Z452 Encounter for adjustment and management of vascular access device: Secondary | ICD-10-CM

## 2020-11-29 DIAGNOSIS — A419 Sepsis, unspecified organism: Secondary | ICD-10-CM | POA: Diagnosis present

## 2020-11-29 DIAGNOSIS — G43909 Migraine, unspecified, not intractable, without status migrainosus: Secondary | ICD-10-CM | POA: Diagnosis present

## 2020-11-29 DIAGNOSIS — I248 Other forms of acute ischemic heart disease: Secondary | ICD-10-CM | POA: Diagnosis not present

## 2020-11-29 HISTORY — DX: Cardiomyopathy, unspecified: I42.9

## 2020-11-29 LAB — ECHOCARDIOGRAM COMPLETE
AR max vel: 1.62 cm2
AV Area VTI: 1.78 cm2
AV Area mean vel: 1.32 cm2
AV Mean grad: 3 mmHg
AV Peak grad: 4.8 mmHg
Ao pk vel: 1.1 m/s
Area-P 1/2: 10.68 cm2
Calc EF: 26.6 %
Height: 68 in
S' Lateral: 4.6 cm
Single Plane A2C EF: 26.9 %
Single Plane A4C EF: 26.1 %
Weight: 3118.19 oz

## 2020-11-29 LAB — BLOOD GAS, ARTERIAL
Acid-base deficit: 5.5 mmol/L — ABNORMAL HIGH (ref 0.0–2.0)
Acid-base deficit: 1.1 mmol/L (ref 0.0–2.0)
Bicarbonate: 22.4 mmol/L (ref 20.0–28.0)
Bicarbonate: 22.7 mmol/L (ref 20.0–28.0)
FIO2: 100
FIO2: 1
MECHVT: 450 mL
MECHVT: 450 mL
O2 Saturation: 89 %
O2 Saturation: 98.4 %
PEEP: 10 cmH2O
PEEP: 10 cmH2O
Patient temperature: 37
Patient temperature: 37
RATE: 22 resp/min
RATE: 30 resp/min
pCO2 arterial: 51 mmHg — ABNORMAL HIGH (ref 32.0–48.0)
pCO2 arterial: 35 mmHg (ref 32.0–48.0)
pH, Arterial: 7.25 — ABNORMAL LOW (ref 7.350–7.450)
pH, Arterial: 7.42 (ref 7.350–7.450)
pO2, Arterial: 66 mmHg — ABNORMAL LOW (ref 83.0–108.0)
pO2, Arterial: 110 mmHg — ABNORMAL HIGH (ref 83.0–108.0)

## 2020-11-29 LAB — CBC WITH DIFFERENTIAL/PLATELET
Abs Immature Granulocytes: 0.06 10*3/uL (ref 0.00–0.07)
Basophils Absolute: 0 10*3/uL (ref 0.0–0.1)
Basophils Relative: 0 %
Eosinophils Absolute: 0 10*3/uL (ref 0.0–0.5)
Eosinophils Relative: 0 %
HCT: 47.7 % — ABNORMAL HIGH (ref 36.0–46.0)
Hemoglobin: 16.1 g/dL — ABNORMAL HIGH (ref 12.0–15.0)
Immature Granulocytes: 0 %
Lymphocytes Relative: 5 %
Lymphs Abs: 0.7 10*3/uL (ref 0.7–4.0)
MCH: 29.9 pg (ref 26.0–34.0)
MCHC: 33.8 g/dL (ref 30.0–36.0)
MCV: 88.5 fL (ref 80.0–100.0)
Monocytes Absolute: 0.6 10*3/uL (ref 0.1–1.0)
Monocytes Relative: 4 %
Neutro Abs: 14.1 10*3/uL — ABNORMAL HIGH (ref 1.7–7.7)
Neutrophils Relative %: 91 %
Platelets: 438 10*3/uL — ABNORMAL HIGH (ref 150–400)
RBC: 5.39 MIL/uL — ABNORMAL HIGH (ref 3.87–5.11)
RDW: 13.2 % (ref 11.5–15.5)
WBC: 15.5 10*3/uL — ABNORMAL HIGH (ref 4.0–10.5)
nRBC: 0 % (ref 0.0–0.2)

## 2020-11-29 LAB — TROPONIN I (HIGH SENSITIVITY)
Troponin I (High Sensitivity): 1341 ng/L (ref <18)
Troponin I (High Sensitivity): 1702 ng/L (ref <18)
Troponin I (High Sensitivity): 2892 ng/L (ref <18)
Troponin I (High Sensitivity): 4037 ng/L (ref <18)

## 2020-11-29 LAB — COMPREHENSIVE METABOLIC PANEL
ALT: 33 U/L (ref 0–44)
AST: 40 U/L (ref 15–41)
Albumin: 3.8 g/dL (ref 3.5–5.0)
Alkaline Phosphatase: 78 U/L (ref 38–126)
Anion gap: 14 (ref 5–15)
BUN: 13 mg/dL (ref 6–20)
CO2: 20 mmol/L — ABNORMAL LOW (ref 22–32)
Calcium: 8.8 mg/dL — ABNORMAL LOW (ref 8.9–10.3)
Chloride: 104 mmol/L (ref 98–111)
Creatinine, Ser: 0.71 mg/dL (ref 0.44–1.00)
GFR, Estimated: >60 mL/min (ref >60)
Glucose, Bld: 355 mg/dL — ABNORMAL HIGH (ref 70–99)
Potassium: 4 mmol/L (ref 3.5–5.1)
Sodium: 138 mmol/L (ref 135–145)
Total Bilirubin: 0.7 mg/dL (ref 0.3–1.2)
Total Protein: 7.3 g/dL (ref 6.5–8.1)

## 2020-11-29 LAB — GLUCOSE, CAPILLARY
Glucose-Capillary: 302 mg/dL — ABNORMAL HIGH (ref 70–99)
Glucose-Capillary: 285 mg/dL — ABNORMAL HIGH (ref 70–99)
Glucose-Capillary: 280 mg/dL — ABNORMAL HIGH (ref 70–99)
Glucose-Capillary: 318 mg/dL — ABNORMAL HIGH (ref 70–99)

## 2020-11-29 LAB — FERRITIN: Ferritin: 116 ng/mL (ref 11–307)

## 2020-11-29 LAB — PROTIME-INR
INR: 1 (ref 0.8–1.2)
Prothrombin Time: 12.7 seconds (ref 11.4–15.2)

## 2020-11-29 LAB — URINALYSIS, COMPLETE (UACMP) WITH MICROSCOPIC
Bilirubin Urine: NEGATIVE
Glucose, UA: >=500 mg/dL — AB
Ketones, ur: 5 mg/dL — AB
Leukocytes,Ua: NEGATIVE
Nitrite: NEGATIVE
Protein, ur: >=300 mg/dL — AB
Specific Gravity, Urine: 1.029 (ref 1.005–1.030)
pH: 5 (ref 5.0–8.0)

## 2020-11-29 LAB — RESP PANEL BY RT-PCR (FLU A&B, COVID) ARPGX2
Influenza A by PCR: NEGATIVE
Influenza B by PCR: NEGATIVE
SARS Coronavirus 2 by RT PCR: POSITIVE — AB

## 2020-11-29 LAB — PREGNANCY, URINE: Preg Test, Ur: NEGATIVE

## 2020-11-29 LAB — LACTIC ACID, PLASMA
Lactic Acid, Venous: 5.4 mmol/L (ref 0.5–1.9)
Lactic Acid, Venous: 4 mmol/L (ref 0.5–1.9)
Lactic Acid, Venous: 4.8 mmol/L (ref 0.5–1.9)
Lactic Acid, Venous: 4.5 mmol/L (ref 0.5–1.9)

## 2020-11-29 LAB — PROCALCITONIN: Procalcitonin: <0.1 ng/mL

## 2020-11-29 LAB — FIBRIN DERIVATIVES D-DIMER (ARMC ONLY): Fibrin derivatives D-dimer (ARMC): 2670 ng/mL (FEU) — ABNORMAL HIGH (ref 0.00–499.00)

## 2020-11-29 LAB — BRAIN NATRIURETIC PEPTIDE: B Natriuretic Peptide: 755.9 pg/mL — ABNORMAL HIGH (ref 0.0–100.0)

## 2020-11-29 LAB — HEMOGLOBIN A1C
Hgb A1c MFr Bld: 6.1 % — ABNORMAL HIGH (ref 4.8–5.6)
Mean Plasma Glucose: 128.37 mg/dL

## 2020-11-29 LAB — STREP PNEUMONIAE URINARY ANTIGEN: Strep Pneumo Urinary Antigen: NEGATIVE

## 2020-11-29 LAB — TRIGLYCERIDES: Triglycerides: 105 mg/dL (ref <150)

## 2020-11-29 LAB — C-REACTIVE PROTEIN: CRP: 0.5 mg/dL (ref <1.0)

## 2020-11-29 LAB — HEPARIN LEVEL (UNFRACTIONATED): Heparin Unfractionated: 0.1 IU/mL — ABNORMAL LOW (ref 0.30–0.70)

## 2020-11-29 LAB — CBG MONITORING, ED
Glucose-Capillary: 417 mg/dL — ABNORMAL HIGH (ref 70–99)
Glucose-Capillary: 375 mg/dL — ABNORMAL HIGH (ref 70–99)

## 2020-11-29 LAB — HIV ANTIBODY (ROUTINE TESTING W REFLEX): HIV Screen 4th Generation wRfx: NONREACTIVE

## 2020-11-29 LAB — APTT: aPTT: 30 seconds (ref 24–36)

## 2020-11-29 LAB — MRSA PCR SCREENING: MRSA by PCR: NEGATIVE

## 2020-11-29 LAB — LIPASE, BLOOD: Lipase: 23 U/L (ref 11–51)

## 2020-11-29 MED ORDER — ACETAMINOPHEN 325 MG PO TABS
650.0000 mg | ORAL_TABLET | Freq: Four times a day (QID) | ORAL | Status: DC | PRN
  Administered 2020-11-29: 650 mg via ORAL
  Filled 2020-11-29: qty 2

## 2020-11-29 MED ORDER — HEPARIN (PORCINE) 25000 UT/250ML-% IV SOLN
1050.0000 [IU]/h | INTRAVENOUS | Status: DC
  Administered 2020-11-29 – 2020-12-02 (×4): 1050 [IU]/h via INTRAVENOUS
  Filled 2020-11-29 (×4): qty 250

## 2020-11-29 MED ORDER — INSULIN ASPART 100 UNIT/ML ~~LOC~~ SOLN
0.0000 [IU] | SUBCUTANEOUS | Status: DC
  Administered 2020-11-29: 8 [IU] via SUBCUTANEOUS
  Administered 2020-11-29: 15 [IU] via SUBCUTANEOUS
  Administered 2020-11-29: 8 [IU] via SUBCUTANEOUS
  Administered 2020-11-30: 11 [IU] via SUBCUTANEOUS
  Filled 2020-11-29 (×4): qty 1

## 2020-11-29 MED ORDER — INSULIN ASPART 100 UNIT/ML ~~LOC~~ SOLN
0.0000 [IU] | Freq: Three times a day (TID) | SUBCUTANEOUS | Status: DC
  Administered 2020-11-29: 9 [IU] via SUBCUTANEOUS
  Filled 2020-11-29: qty 1

## 2020-11-29 MED ORDER — FENTANYL BOLUS VIA INFUSION
50.0000 ug | INTRAVENOUS | Status: DC | PRN
  Administered 2020-11-29: 50 ug via INTRAVENOUS
  Filled 2020-11-29: qty 50

## 2020-11-29 MED ORDER — METHYLPREDNISOLONE SODIUM SUCC 125 MG IJ SOLR
60.0000 mg | Freq: Two times a day (BID) | INTRAMUSCULAR | Status: DC
  Administered 2020-11-29 – 2020-11-30 (×2): 60 mg via INTRAVENOUS
  Filled 2020-11-29 (×2): qty 2

## 2020-11-29 MED ORDER — HYDRALAZINE HCL 20 MG/ML IJ SOLN
5.0000 mg | INTRAMUSCULAR | Status: DC | PRN
  Administered 2020-11-29: 09:00:00 5 mg via INTRAVENOUS
  Filled 2020-11-29: qty 1

## 2020-11-29 MED ORDER — METHYLPREDNISOLONE SODIUM SUCC 40 MG IJ SOLR: 40.0000 mg | Freq: Two times a day (BID) | INTRAMUSCULAR | Status: DC

## 2020-11-29 MED ORDER — SODIUM CHLORIDE 0.9 % IV SOLN
200.0000 mg | Freq: Once | INTRAVENOUS | Status: AC
  Administered 2020-11-29: 200 mg via INTRAVENOUS
  Filled 2020-11-29: qty 200

## 2020-11-29 MED ORDER — NITROGLYCERIN 0.4 MG SL SUBL: 0.4000 mg | SUBLINGUAL_TABLET | Freq: Once | SUBLINGUAL | Status: AC

## 2020-11-29 MED ORDER — MIDAZOLAM HCL 2 MG/2ML IJ SOLN
2.0000 mg | INTRAMUSCULAR | Status: DC | PRN
  Administered 2020-11-29: 2 mg via INTRAVENOUS

## 2020-11-29 MED ORDER — POLYETHYLENE GLYCOL 3350 17 G PO PACK
17.0000 g | PACK | Freq: Every day | ORAL | Status: DC
  Administered 2020-11-29 – 2020-11-30 (×2): 17 g
  Filled 2020-11-29 (×2): qty 1

## 2020-11-29 MED ORDER — ORAL CARE MOUTH RINSE
15.0000 mL | Freq: Two times a day (BID) | OROMUCOSAL | Status: DC
  Administered 2020-11-29 – 2020-11-30 (×3): 15 mL via OROMUCOSAL

## 2020-11-29 MED ORDER — SODIUM CHLORIDE 0.9 % IV SOLN
2.0000 g | INTRAVENOUS | Status: DC
  Administered 2020-11-29 – 2020-12-03 (×5): 2 g via INTRAVENOUS
  Filled 2020-11-29 (×4): qty 20
  Filled 2020-11-29: qty 2
  Filled 2020-11-29 (×2): qty 20

## 2020-11-29 MED ORDER — DEXAMETHASONE SODIUM PHOSPHATE 10 MG/ML IJ SOLN
10.0000 mg | Freq: Once | INTRAMUSCULAR | Status: AC
  Administered 2020-11-29: 10 mg via INTRAVENOUS
  Filled 2020-11-29: qty 1

## 2020-11-29 MED ORDER — NOREPINEPHRINE 4 MG/250ML-% IV SOLN
0.0000 ug/min | INTRAVENOUS | Status: AC
  Administered 2020-11-29: 14 ug/min via INTRAVENOUS
  Administered 2020-11-29: 20 ug/min via INTRAVENOUS
  Filled 2020-11-29 (×2): qty 250

## 2020-11-29 MED ORDER — INSULIN ASPART 100 UNIT/ML ~~LOC~~ SOLN: 0.0000 [IU] | Freq: Every day | SUBCUTANEOUS | Status: DC

## 2020-11-29 MED ORDER — ASPIRIN 81 MG PO CHEW: 324.0000 mg | CHEWABLE_TABLET | ORAL | Status: AC

## 2020-11-29 MED ORDER — IPRATROPIUM BROMIDE HFA 17 MCG/ACT IN AERS
2.0000 | INHALATION_SPRAY | RESPIRATORY_TRACT | Status: DC
  Filled 2020-11-29: qty 12.9

## 2020-11-29 MED ORDER — MIDAZOLAM HCL 2 MG/2ML IJ SOLN: 2.0000 mg | INTRAMUSCULAR | Status: DC | PRN

## 2020-11-29 MED ORDER — CHLORHEXIDINE GLUCONATE 0.12 % MT SOLN
15.0000 mL | Freq: Two times a day (BID) | OROMUCOSAL | Status: DC
  Administered 2020-11-29 – 2020-12-02 (×4): 15 mL via OROMUCOSAL
  Filled 2020-11-29 (×6): qty 15

## 2020-11-29 MED ORDER — FAMOTIDINE IN NACL 20-0.9 MG/50ML-% IV SOLN
20.0000 mg | Freq: Two times a day (BID) | INTRAVENOUS | Status: DC
  Administered 2020-11-29 – 2020-12-02 (×7): 20 mg via INTRAVENOUS
  Filled 2020-11-29 (×12): qty 50

## 2020-11-29 MED ORDER — LORAZEPAM 2 MG/ML IJ SOLN: 1.0000 mg | Freq: Once | INTRAMUSCULAR | Status: AC

## 2020-11-29 MED ORDER — SODIUM CHLORIDE 0.9 % IV SOLN
100.0000 mg | Freq: Every day | INTRAVENOUS | Status: AC
  Administered 2020-11-30 – 2020-12-03 (×4): 100 mg via INTRAVENOUS
  Filled 2020-11-29: qty 20
  Filled 2020-11-29: qty 100
  Filled 2020-11-29 (×4): qty 20

## 2020-11-29 MED ORDER — HEPARIN BOLUS VIA INFUSION
4000.0000 [IU] | Freq: Once | INTRAVENOUS | Status: AC
  Administered 2020-11-29: 4000 [IU] via INTRAVENOUS
  Filled 2020-11-29: qty 4000

## 2020-11-29 MED ORDER — POLYETHYLENE GLYCOL 3350 17 G PO PACK: 17.0000 g | PACK | Freq: Every day | ORAL | Status: DC | PRN

## 2020-11-29 MED ORDER — SODIUM CHLORIDE 0.9 % IV SOLN
250.0000 mL | INTRAVENOUS | Status: DC
  Administered 2020-11-30 – 2020-12-01 (×2): 250 mL via INTRAVENOUS

## 2020-11-29 MED ORDER — CHLORHEXIDINE GLUCONATE CLOTH 2 % EX PADS
6.0000 | MEDICATED_PAD | Freq: Every day | CUTANEOUS | Status: DC
  Administered 2020-11-29 – 2020-12-03 (×3): 6 via TOPICAL

## 2020-11-29 MED ORDER — NOREPINEPHRINE 16 MG/250ML-% IV SOLN
2.0000 ug/min | INTRAVENOUS | Status: DC
  Filled 2020-11-29: qty 250

## 2020-11-29 MED ORDER — NOREPINEPHRINE 4 MG/250ML-% IV SOLN
INTRAVENOUS | Status: AC
  Administered 2020-11-29: 4 ug/min via INTRAVENOUS
  Filled 2020-11-29: qty 250

## 2020-11-29 MED ORDER — LORAZEPAM 2 MG/ML IJ SOLN
INTRAMUSCULAR | Status: AC
  Administered 2020-11-29: 1 mg via INTRAVENOUS
  Filled 2020-11-29: qty 1

## 2020-11-29 MED ORDER — MIDAZOLAM HCL 2 MG/2ML IJ SOLN
2.0000 mg | INTRAMUSCULAR | Status: DC | PRN
  Administered 2020-11-29 (×2): 2 mg via INTRAVENOUS

## 2020-11-29 MED ORDER — DOCUSATE SODIUM 100 MG PO CAPS: 100.0000 mg | ORAL_CAPSULE | Freq: Two times a day (BID) | ORAL | Status: DC | PRN

## 2020-11-29 MED ORDER — FENTANYL CITRATE (PF) 100 MCG/2ML IJ SOLN
100.0000 ug | INTRAMUSCULAR | Status: DC | PRN
  Administered 2020-11-29: 100 ug via INTRAVENOUS
  Filled 2020-11-29: qty 2

## 2020-11-29 MED ORDER — NITROGLYCERIN 0.4 MG SL SUBL
SUBLINGUAL_TABLET | SUBLINGUAL | Status: AC
  Administered 2020-11-29: 0.4 mg via SUBLINGUAL
  Filled 2020-11-29: qty 1

## 2020-11-29 MED ORDER — SODIUM CHLORIDE 0.9 % IV SOLN
500.0000 mg | INTRAVENOUS | Status: DC
  Administered 2020-11-29 – 2020-12-03 (×5): 500 mg via INTRAVENOUS
  Filled 2020-11-29 (×6): qty 500

## 2020-11-29 MED ORDER — VECURONIUM BROMIDE 10 MG IV SOLR: 10.0000 mg | INTRAVENOUS | Status: DC | PRN

## 2020-11-29 MED ORDER — PROMETHAZINE HCL 25 MG/ML IJ SOLN
12.5000 mg | Freq: Four times a day (QID) | INTRAMUSCULAR | Status: DC | PRN
  Administered 2020-11-29: 06:00:00 12.5 mg via INTRAVENOUS
  Filled 2020-11-29: qty 1

## 2020-11-29 MED ORDER — PROPOFOL 1000 MG/100ML IV EMUL
INTRAVENOUS | Status: AC
  Administered 2020-11-29: 30 ug/kg/min via INTRAVENOUS
  Filled 2020-11-29: qty 100

## 2020-11-29 MED ORDER — FENTANYL CITRATE (PF) 100 MCG/2ML IJ SOLN: 100.0000 ug | INTRAMUSCULAR | Status: DC | PRN

## 2020-11-29 MED ORDER — NOREPINEPHRINE 4 MG/250ML-% IV SOLN: 2.0000 ug/min | INTRAVENOUS | Status: DC

## 2020-11-29 MED ORDER — FUROSEMIDE 10 MG/ML IJ SOLN
40.0000 mg | Freq: Once | INTRAMUSCULAR | Status: AC
  Administered 2020-11-29: 40 mg via INTRAVENOUS
  Filled 2020-11-29: qty 4

## 2020-11-29 MED ORDER — FENTANYL 2500MCG IN NS 250ML (10MCG/ML) PREMIX INFUSION
50.0000 ug/h | INTRAVENOUS | Status: DC
  Administered 2020-11-29: 50 ug/h via INTRAVENOUS
  Administered 2020-11-30: 100 ug/h via INTRAVENOUS
  Filled 2020-11-29 (×2): qty 250

## 2020-11-29 MED ORDER — ALBUTEROL SULFATE HFA 108 (90 BASE) MCG/ACT IN AERS: 2.0000 | INHALATION_SPRAY | RESPIRATORY_TRACT | Status: DC | PRN

## 2020-11-29 MED ORDER — LOPERAMIDE HCL 2 MG PO CAPS: 2.0000 mg | ORAL_CAPSULE | Freq: Two times a day (BID) | ORAL | Status: DC | PRN

## 2020-11-29 MED ORDER — SODIUM CHLORIDE 0.9 % IV BOLUS: 1000.0000 mL | Freq: Once | INTRAVENOUS | Status: DC

## 2020-11-29 MED ORDER — ZINC SULFATE 220 (50 ZN) MG PO CAPS
220.0000 mg | ORAL_CAPSULE | Freq: Every day | ORAL | Status: DC
  Administered 2020-11-29: 220 mg via ORAL
  Filled 2020-11-29: qty 1

## 2020-11-29 MED ORDER — ETOMIDATE 2 MG/ML IV SOLN
INTRAVENOUS | Status: AC | PRN
  Administered 2020-11-29: 30 mg via INTRAVENOUS

## 2020-11-29 MED ORDER — NOREPINEPHRINE 4 MG/250ML-% IV SOLN: 0.0000 ug/min | INTRAVENOUS | Status: DC

## 2020-11-29 MED ORDER — GUAIFENESIN 100 MG/5ML PO SOLN
5.0000 mL | Freq: Once | ORAL | Status: DC
  Filled 2020-11-29 (×2): qty 5

## 2020-11-29 MED ORDER — NOREPINEPHRINE 16 MG/250ML-% IV SOLN
0.0000 ug/min | INTRAVENOUS | Status: DC
  Administered 2020-11-30: 20 ug/min via INTRAVENOUS
  Filled 2020-11-29: qty 250

## 2020-11-29 MED ORDER — FENTANYL CITRATE (PF) 100 MCG/2ML IJ SOLN
50.0000 ug | Freq: Once | INTRAMUSCULAR | Status: AC
  Administered 2020-11-29: 50 ug via INTRAVENOUS

## 2020-11-29 MED ORDER — MIDAZOLAM HCL 2 MG/2ML IJ SOLN
2.0000 mg | INTRAMUSCULAR | Status: DC | PRN
  Filled 2020-11-29: qty 2

## 2020-11-29 MED ORDER — PROPOFOL 1000 MG/100ML IV EMUL
0.0000 ug/kg/min | INTRAVENOUS | Status: DC
  Administered 2020-11-29: 30 ug/kg/min via INTRAVENOUS
  Administered 2020-11-30: 20 ug/kg/min via INTRAVENOUS
  Administered 2020-11-30: 35 ug/kg/min via INTRAVENOUS
  Filled 2020-11-29 (×3): qty 100

## 2020-11-29 MED ORDER — DM-GUAIFENESIN ER 30-600 MG PO TB12: 1.0000 | ORAL_TABLET | Freq: Two times a day (BID) | ORAL | Status: DC | PRN

## 2020-11-29 MED ORDER — IOHEXOL 350 MG/ML SOLN
75.0000 mL | Freq: Once | INTRAVENOUS | Status: AC | PRN
  Administered 2020-11-29: 75 mL via INTRAVENOUS

## 2020-11-29 MED ORDER — SODIUM CHLORIDE 0.9 % IV BOLUS
1000.0000 mL | Freq: Once | INTRAVENOUS | Status: AC
  Administered 2020-11-29: 1000 mL via INTRAVENOUS

## 2020-11-29 MED ORDER — ASCORBIC ACID 500 MG PO TABS
500.0000 mg | ORAL_TABLET | Freq: Every day | ORAL | Status: DC
  Administered 2020-11-29: 500 mg via ORAL
  Filled 2020-11-29: qty 1

## 2020-11-29 MED ORDER — ONDANSETRON HCL 4 MG/2ML IJ SOLN
4.0000 mg | Freq: Three times a day (TID) | INTRAMUSCULAR | Status: DC | PRN
  Administered 2020-12-01: 4 mg via INTRAVENOUS
  Filled 2020-11-29: qty 2

## 2020-11-29 MED ORDER — DOCUSATE SODIUM 50 MG/5ML PO LIQD
100.0000 mg | Freq: Two times a day (BID) | ORAL | Status: DC
  Administered 2020-11-30 – 2020-12-03 (×2): 100 mg
  Filled 2020-11-29 (×5): qty 10

## 2020-11-29 MED ORDER — SUCCINYLCHOLINE CHLORIDE 20 MG/ML IJ SOLN
INTRAMUSCULAR | Status: AC | PRN
  Administered 2020-11-29: 100 mg via INTRAVENOUS

## 2020-11-29 MED ORDER — ASPIRIN 300 MG RE SUPP
300.0000 mg | RECTAL | Status: AC
  Administered 2020-11-29: 300 mg via RECTAL
  Filled 2020-11-29: qty 1

## 2020-11-29 MED ORDER — ASPIRIN 300 MG RE SUPP: 300.0000 mg | Freq: Every day | RECTAL | Status: DC

## 2020-11-29 NOTE — Procedures (Signed)
Arterial Catheter Insertion Procedure Note  Krystal Gregory  [id number]  [birth year]  Date:11/29/20  Time:6:00 PM    Provider Performing: Bradly Bienenstock    Procedure: Insertion of Arterial Line [phone]) with US guidance ([id number])   Indication(s) Blood pressure monitoring and/or need for frequent ABGs  Consent Risks of the procedure as well as the alternatives and risks of each were explained to the patient and/or caregiver.  Consent for the procedure was obtained and is signed in the bedside chart  Anesthesia None   Time Out Verified patient identification, verified procedure, site/side was marked, verified correct patient position, special equipment/implants available, medications/allergies/relevant history reviewed, required imaging and test results available.   Sterile Technique Maximal sterile technique including full sterile barrier drape, hand hygiene, sterile gown, sterile gloves, mask, hair covering, sterile ultrasound probe cover (if used).   Procedure Description Area of catheter insertion was cleaned with chlorhexidine and draped in sterile fashion. With real-time ultrasound guidance an arterial catheter was placed into the left femoral artery.  Appropriate arterial tracings confirmed on monitor.     Complications/Tolerance None; patient tolerated the procedure well.   EBL Minimal   Specimen(s) None  BIOPATCH applied to the insertion site.   Darel Hong, AGACNP-BC [facility] Pager: [phone]

## 2020-11-29 NOTE — Progress Notes (Signed)
Remdesivir - Pharmacy Brief Note   O:  ALT: 33 CXR: Dense consolidative and streaky opacities throughout the right lung with more mild opacities in the left lung base as well, can reflect a multifocal infection/pneumonia in the appropriate clinical setting. SpO2: 98% on nasal cannula   A/P:  Remdesivir 200 mg IVPB once followed by 100 mg IVPB daily x 4 days.   Chinita Greenland PharmD Clinical Pharmacist 11/29/2020

## 2020-11-29 NOTE — Progress Notes (Signed)
Pt transported to CT and then to ICU with out incident.

## 2020-11-29 NOTE — Progress Notes (Signed)
Ventilator rate increase to 30 per ED MD based on ABG result

## 2020-11-29 NOTE — Progress Notes (Signed)
[place] for heparin Indication: chest pain/ACS  Allergies  Allergen Reactions  . Penicillins Rash    Patient Measurements: Height: 5\' 8"  (172.7 cm) Weight: 88.4 kg (194 lb 14.2 oz) IBW/kg (Calculated) : 63.9 Heparin Dosing Weight: 82.4 kg  Vital Signs: Temp: 100.4 F (38 C) (01/14 1730) Temp Source: Bladder (01/14 1440) BP: 82/51 (01/14 1730) Pulse Rate: 95 (01/14 1730)  Labs: Recent Labs    11/29/20 0533 11/29/20 0600 11/29/20 0600 11/29/20 0926 11/29/20 1135 11/29/20 1318 11/29/20 1724  HGB  --  16.1*  --   --   --   --   --   HCT  --  47.7*  --   --   --   --   --   PLT  --  438*  --   --   --   --   --   APTT  --   --   --  30  --   --   --   LABPROT  --   --   --  12.7  --   --   --   INR  --   --   --  1.0  --   --   --   HEPARINUNFRC  --   --   --   --   --   --  0.10*  CREATININE 0.71  --   --   --   --   --   --   TROPONINIHS  --  1,341*   < > 1,702* 2,892* 4,037*  --    < > = values in this interval not displayed.    Estimated Creatinine Clearance: 96.8 mL/min (by C-G formula based on SCr of 0.71 mg/dL).   Medical History: Past Medical History:  Diagnosis Date  . Cardiomyopathy ([place])    a. 11/2020 Echo: EF 20-25%, no rwma, glob HK, nl RV size/fxn.  Marland Kitchen COVID-19 virus infection 11/2020  . Migraines      Assessment: 50 year old female admitted with acute hypoxic respiratory failure s/t Covid and pulmonary edema. Patient failed BiPAP, requiring intubation. Pharmacy consult for heparin drip for ACS/STEMI.  1/14 1300 INITIAL =Heparin 4000 unit bolus followed by drip at 1050 units/hr.  1/14 1754 HL = < 0.10 -- per nurse has been on hold for line placement  Goal of Therapy:  Heparin level 0.3-0.7 units/ml Monitor platelets by anticoagulation protocol: Yes   Plan:  Heparin subtherapeutic. After speaking with nurse, heparin infusion has been stopped ~ 2 hours. Infusion to be resumed now. Will re-check heparin  level in 6 hours following infusion resuming.  CBC daily while on heparin drip.  Dorothe Pea, PharmD, BCPS Clinical Pharmacist  11/29/2020,6:00 PM

## 2020-11-29 NOTE — Progress Notes (Signed)
Cardiology Consult    Patient ID: KANOSHA BOERST MRN: [id number], DOB/AGE: 52-09-1971   Admit date: 11/29/2020 Date of Consult: 11/29/2020  Primary Physician: Vania Rea, MD Primary Cardiologist: New - seen by M. Fletcher Anon, MD  Requesting Provider: F. Aleskerov, MD  Patient Profile    Krystal Gregory is a 50 y.o. female with a history of migraines, who is being seen today for the evaluation of acute pulmonary edema and NSTEMI in the setting of COVID-19 at the request of Dr. Lanney Gins.  Past Medical History   Past Medical History:  Diagnosis Date  . Cardiomyopathy ([place])    a. 11/2020 Echo: EF 20-25%, no rwma, glob HK, nl RV size/fxn.  Marland Kitchen COVID-19 virus infection 11/2020  . Migraines     History reviewed. No pertinent surgical history.   Allergies  Allergies  Allergen Reactions  . Penicillins Rash    History of Present Illness    50 y/o ? w/ a h/o migraines and remote tob abuse.  No known prior cardiac history.  She is currently intubated and sedated and therefore history is obtained through previous notes.  Patient lives locally with a 57 year old son.  She was apparently in her usual state of health until the morning of January 13, when she began to experience nausea, vomiting, cough, congestion, malaise, and weakness. Over the subsequent 24 hours, said symptoms progressed rapidly and became associated with presyncope. She presented to the emergency department in the early morning hours of January 14. Labs are notable for a white count of 15.5, glucose of 355, BNP of 755.9, troponin I of 1341, and lactic acid of 5.4. D-dimer returned elevated at 2670. Initial chest x-ray showed dense consolidative and streaky opacities throughout the right lung with more mild opacities in the left lung base. COVID PCR was positive. Abdominal imaging was unremarkable. Within 3 hours of presentation, she had progressive tachypnea and hypoxia, and complained that she felt like she was drowning. She was  noted to have copious white frothy secretions and was also markedly tachycardic and hypertensive up to 214/100. She was subsequently intubated and sedated. Following this, she became hypotensive and required initiation of vasopressor therapy. CTA of the chest showed no evidence of PE. Small bilateral pleural effusions were noted in addition to areas of patchy pulmonary consolidation including dependent consolidation in both lungs. Due to concern for acute pulmonary edema as a contributing factor to her hypoxia and respiratory decline, she was treated with IV Lasix. Her troponin has since risen to 4037. She has been admitted to ICU and is currently being heparinized. She received aspirin suppository in the emergency department.  Inpatient Medications    . vitamin C  500 mg Oral Daily  . chlorhexidine  15 mL Mouth Rinse BID  . Chlorhexidine Gluconate Cloth  6 each Topical Daily  . docusate  100 mg Per Tube BID  . guaiFENesin  5 mL Oral Once  . insulin aspart  0-15 Units Subcutaneous Q4H  . ipratropium  2 puff Inhalation Q4H  . mouth rinse  15 mL Mouth Rinse q12n4p  . methylPREDNISolone (SOLU-MEDROL) injection  60 mg Intravenous Q12H  . polyethylene glycol  17 g Per Tube Daily  . zinc sulfate  220 mg Oral Daily    Family History    History reviewed. No pertinent family history. She indicated that her mother is alive. She indicated that her father is alive. No known premature CAD.  Social History    Social History   Socioeconomic  History  . Marital status: Single    Spouse name: Not on file  . Number of children: Not on file  . Years of education: Not on file  . Highest education level: Not on file  Occupational History  . Not on file  Tobacco Use  . Smoking status: Former Smoker    Types: Cigarettes    Quit date: 05/16/1999    Years since quitting: 21.5  . Smokeless tobacco: Never Used  Substance and Sexual Activity  . Alcohol use: Yes    Comment: 2 bottles wine per month  .  Drug use: Yes    Types: Marijuana  . Sexual activity: Not on file  Other Topics Concern  . Not on file  Social History Narrative  . Not on file   Social Determinants of Health   Financial Resource Strain: Not on file  Food Insecurity: Not on file  Transportation Needs: Not on file  Physical Activity: Not on file  Stress: Not on file  Social Connections: Not on file  Intimate Partner Violence: Not on file     Review of Systems    Patient is intubated and sedated and unable to provide review of systems at this time. Based on prior notes, she complained of dyspnea and drowning sensation prior to intubation.  Physical Exam    Blood pressure (!) 85/60, pulse 96, temperature (!) 100.58 F (38.1 C), resp. rate 20, height 5\' 8"  (1.727 m), weight 88.4 kg, SpO2 98 %.  General: Intubated, sedated. Psych: Sedated. Neuro: Sedated. HEENT: Normal  Neck: Supple without bruits or JVD. Lungs:  Resp regular and unlabored, diminished breath sounds bilaterally. Heart: RRR, distant, no s3, s4, or murmurs. Abdomen: Soft, non-tender, non-distended, BS + x 4.  Extremities: No clubbing, cyanosis or edema. DP/PT2+, Radials 2+ and equal bilaterally.  Labs    Cardiac Enzymes Recent Labs  Lab 11/29/20 0600 11/29/20 0926 11/29/20 1135 11/29/20 1318  TROPONINIHS 1,341* 1,702* 2,892* 4,037*      Lab Results  Component Value Date   WBC 15.5 (H) 11/29/2020   HGB 16.1 (H) 11/29/2020   HCT 47.7 (H) 11/29/2020   MCV 88.5 11/29/2020   PLT 438 (H) 11/29/2020    Recent Labs  Lab 11/29/20 0533  NA 138  K 4.0  CL 104  CO2 20*  BUN 13  CREATININE 0.71  CALCIUM 8.8*  PROT 7.3  BILITOT 0.7  ALKPHOS 78  ALT 33  AST 40  GLUCOSE 355*   Lab Results  Component Value Date   TRIG 105 11/29/2020     Radiology Studies    DG Chest 1 View  Result Date: 11/29/2020 CLINICAL DATA:  Shortness of breath EXAM: CHEST  1 VIEW COMPARISON:  Radiograph 01/16/2007 FINDINGS: Dense consolidative and  streaky opacities are seen throughout the right lung with more mild opacities in the left lung base as well. No pneumothorax. No visible effusion. Pulmonary vascularity remains fairly well defined. Stable cardiomediastinal contours. No acute osseous or soft tissue abnormality. IMPRESSION: Dense consolidative and streaky opacities throughout the right lung with more mild opacities in the left lung base as well, can reflect a multifocal infection/pneumonia in the appropriate clinical setting. Electronically Signed   By: Lovena Le M.D.   On: 11/29/2020 05:54   DG Abdomen 1 View  Result Date: 11/29/2020 CLINICAL DATA:  Orogastric tube placement EXAM: ABDOMEN - 1 VIEW COMPARISON:  None. FINDINGS: Orogastric tube tip and side port are in the stomach. There is no bowel dilatation or  air-fluid level to suggest bowel obstruction. No free air. Intrauterine device in mid pelvis. IMPRESSION: Orogastric tube tip and side port in stomach. No evident bowel obstruction or free air. Intrauterine device in mid pelvis. Electronically Signed   By: Lowella Grip III M.D.   On: 11/29/2020 08:57   CT ANGIO CHEST PE W OR WO CONTRAST  Result Date: 11/29/2020 CLINICAL DATA:  Positive D-dimer. Shortness of breath. Coronavirus infection. EXAM: CT ANGIOGRAPHY CHEST WITH CONTRAST TECHNIQUE: Multidetector CT imaging of the chest was performed using the standard protocol during bolus administration of intravenous contrast. Multiplanar CT image reconstructions and MIPs were obtained to evaluate the vascular anatomy. CONTRAST:  23mL OMNIPAQUE IOHEXOL 350 MG/ML SOLN COMPARISON:  Chest radiography earlier same day FINDINGS: Cardiovascular: Heart size is normal. No pericardial effusion. Pulmonary arterial opacification is excellent. No pulmonary emboli. No aortic atherosclerotic calcification or coronary artery calcification. Mediastinum/Nodes: No mediastinal or hilar mass or lymphadenopathy. Lungs/Pleura: Small bilateral pleural  effusions. Areas of patchy pulmonary consolidation including dependent consolidation in both lungs. Pattern is somewhat atypical for coronavirus infection alone and there could be coronavirus infection with superimposed bacterial pneumonia. No pneumothorax. Upper Abdomen: Multiple gallstones dependent in the gallbladder. Orogastric tube enters the stomach. Musculoskeletal: Normal Review of the MIP images confirms the above findings. IMPRESSION: 1. No pulmonary emboli. 2. Small bilateral pleural effusions. Areas of patchy pulmonary consolidation including dependent consolidation in both lungs. Pattern is somewhat atypical for coronavirus infection alone and there could be superimposed bacterial pneumonia. 3. Chololithiasis. Electronically Signed   By: Nelson Chimes M.D.   On: 11/29/2020 14:29   DG Chest Portable 1 View  Result Date: 11/29/2020 CLINICAL DATA:  Hypoxia EXAM: PORTABLE CHEST 1 VIEW COMPARISON:  November 29, 2020 study obtained earlier in the day FINDINGS: Endotracheal tube tip is 4.6 cm above the carina. Nasogastric tube tip and side port are below the diaphragm. No pneumothorax. There is airspace consolidation throughout much of the right lung, increased from earlier in the day. There is airspace opacity in the medial left base. There is mild cardiomegaly with pulmonary venous hypertension. No adenopathy evident. IMPRESSION: Tube positions as described without pneumothorax. Extensive airspace opacity throughout much of the right lung, slightly increased. Ill-defined infiltrate left base. There is a degree of cardiomegaly with pulmonary vascular congestion. Electronically Signed   By: Lowella Grip III M.D.   On: 11/29/2020 08:56    ECG & Cardiac Imaging    RSR, 97, lat TWI - personally reviewed.  Assessment & Plan    1. Cardiomyopathy/acute pulmonary edema: Patient presented early this morning with a 24-hour history of progressive dyspnea, tachypnea, nausea, vomiting, malaise, weakness,  and presyncope. She became markedly hypertensive and tachycardic in the setting of progressive hypoxia and pulmonary secretions requiring intubation and sedation. She is positive for COVID-19 infection however imaging also concerning for pulmonary edema. She received intravenous Lasix in the emergency department. Echocardiogram has been performed and shows an EF of 20-25% with global hypokinesis. Troponin, which was 1341 on arrival, has risen to 4037. ECG is without acute ST or T changes (old lateral T wave inversion noted). CTA of the chest was negative for PE and also did not show any significant coronary calcium. She appears euvolemic on examination. Clinical picture possibly consistent with COVID-related myocarditis versus hypertensive urgency/hypertensive cardiomyopathy. Continue aspirin and heparin therapy. Monitor volume status closely with low threshold for additional diuresis if necessary. Will eventually require diagnostic catheterization once respiratory status stabilized.  2. Non-STEMI: In the setting of  COVID-19 infection with acute pulmonary edema and new finding of cardiomyopathy as outlined above. No known prior history of chest pain per notes. No significant coronary calcification noted on CT angiography of the chest. As above, continue on aspirin and heparin. No beta-blocker in the setting of hypotension requiring pressor therapy. Not currently on a statin as she is not taking p.o.'s. Eventual diagnostic catheterization once clinical status stabilized.  3. Acute hypoxic respiratory failure/COVID-19 infection: Vent management, remdesivir, steroids, nebulizer therapy per critical care medicine.  4. Hyperglycemia: Insulin management per critical care medicine. No prior history of diabetes and A1c only mildly elevated at 6.1 earlier today.  Signed, Murray Hodgkins, NP 11/29/2020, 4:24 PM  For questions or updates, please contact   Please consult [email or web address] for contact info under  Cardiology/STEMI.

## 2020-11-29 NOTE — ED Notes (Signed)
Patient became progressively more distressed, anxious, tachypneic, hypoxic. Constantly moaning, yelling, saying, "I can't breathe." Spitting up copious pink, frothy sputum. MD to bedside. Patient placed on bipap while preparing for intubation then intubated shortly after.   Patient only received ~1L NS total.

## 2020-11-29 NOTE — Progress Notes (Signed)
[facility] for heparin Indication: chest pain/ACS  Allergies  Allergen Reactions  . Penicillins Rash    Patient Measurements: Height: 5\' 8"  (172.7 cm) Weight: 88.4 kg (194 lb 14.2 oz) IBW/kg (Calculated) : 63.9 Heparin Dosing Weight: 82.4 kg  Vital Signs: Temp: 100.7 F (38.2 C) (01/14 1300) Temp Source: Rectal (01/14 0700) BP: 93/70 (01/14 1300) Pulse Rate: 124 (01/14 1300)  Labs: Recent Labs    11/29/20 0533 11/29/20 0600 11/29/20 0926 11/29/20 1135  HGB  --  16.1*  --   --   HCT  --  47.7*  --   --   PLT  --  438*  --   --   APTT  --   --  30  --   LABPROT  --   --  12.7  --   INR  --   --  1.0  --   CREATININE 0.71  --   --   --   TROPONINIHS  --  1,341* 1,702* 2,892*    Estimated Creatinine Clearance: 96.8 mL/min (by C-G formula based on SCr of 0.71 mg/dL).   Medical History: Past Medical History:  Diagnosis Date  . Migraines   . Migraines      Assessment: 50 year old female admitted with acute hypoxic respiratory failure s/t Covid and pulmonary edema. Patient failed BiPAP, requiring intubation. Pharmacy consult for heparin drip for ACS/STEMI.  Goal of Therapy:  Heparin level 0.3-0.7 units/ml Monitor platelets by anticoagulation protocol: Yes   Plan:  Heparin 4000 unit bolus followed by drip at 1050 units/hr. Check HL at 1700. CBC daily while on heparin drip.  Tawnya Crook, PharmD 11/29/2020,1:24 PM

## 2020-11-29 NOTE — Progress Notes (Signed)
*  PRELIMINARY RESULTS* Echocardiogram 2D Echocardiogram has been performed.  Sherrie Sport 11/29/2020, 12:39 PM

## 2020-11-29 NOTE — Procedures (Signed)
Central Venous Catheter Insertion Procedure Note  NICKOLETTE ESPINOLA  [id number]  March 07, 1971  Date:11/29/20  Time:5:57 PM   Provider Performing:Stachia Slutsky D Dewaine Conger   Procedure: Insertion of Non-tunneled Central Venous [id number]) with US guidance (50037)   Indication(s) Medication administration and Difficult access  Consent Risks of the procedure as well as the alternatives and risks of each were explained to the patient and/or caregiver.  Consent for the procedure was obtained and is signed in the bedside chart  Anesthesia Topical only with 1% lidocaine   Timeout Verified patient identification, verified procedure, site/side was marked, verified correct patient position, special equipment/implants available, medications/allergies/relevant history reviewed, required imaging and test results available.  Sterile Technique Maximal sterile technique including full sterile barrier drape, hand hygiene, sterile gown, sterile gloves, mask, hair covering, sterile ultrasound probe cover (if used).  Procedure Description Area of catheter insertion was cleaned with chlorhexidine and draped in sterile fashion.  With real-time ultrasound guidance a central venous catheter was placed into the left internal jugular vein. Nonpulsatile blood flow and easy flushing noted in all ports.  The catheter was sutured in place and sterile dressing applied.  Complications/Tolerance None; patient tolerated the procedure well. Chest X-ray is ordered to verify placement for internal jugular or subclavian cannulation.   Chest x-ray is not ordered for femoral cannulation.  EBL Minimal  Specimen(s) None   Line secured at the 20 cm mark.  BIOPATCH applied to the insertion site.   Darel Hong, AGACNP-BC [facility] Pager: [phone]

## 2020-11-29 NOTE — ED Triage Notes (Signed)
Pt to ED via EMS from home c/o SOB, n/v/d since 5pm yesterday and getting worse.  Pt found to be 88% RA with EMS and placed on 6L [place] to get 96%, HTN at 182/119.  Pt presents A&Ox4, pale skin, labored breathing.  Denies medical or surgical hx, denies being around anyone sick.

## 2020-11-29 NOTE — ED Provider Notes (Signed)
[facility] Emergency Department Provider Note    Event Date/Time   First MD Initiated Contact with Patient 11/29/20 [phone]     (approximate)  I have reviewed the triage vital signs and the nursing notes.   HISTORY  Chief Complaint Shortness of Breath, Nausea, Vomiting, and Diarrhea    HPI Krystal Gregory is a 50 y.o. female below listed past medical history presents ER for evaluation of nausea vomiting cough congestion generalized malaise.  Symptoms started off with 24 hours ago and are becoming rapidly progressive.  Feeling like she is about to pass out she feels so weak.  No known sick contacts.  Has not had any measured temperatures or fevers.  Denies any pain just primarily feels nauseated at this time is also having frequent cough.    Past Medical History:  Diagnosis Date  . Migraines   . Migraines    History reviewed. No pertinent family history. History reviewed. No pertinent surgical history. Patient Active Problem List   Diagnosis Date Noted  . Major depressive disorder, recurrent severe without psychotic features ([place]) 08/06/2020  . 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 Authorizing Provider  diphenhydrAMINE (BENADRYL) 25 mg capsule Take 100 mg by mouth every 6 (six) hours as needed. Sleep and congestion    [provider]  ibuprofen (ADVIL,MOTRIN) 800 MG tablet Take 1 tablet (800 mg total) by mouth 3 (three) times daily. 11/29/12   Kirichenko, Lahoma Rocker, PA-C  SUMAtriptan (IMITREX) 100 MG tablet Take 100 mg by mouth every 2 (two) hours as needed. Migraines    [provider]    Allergies Penicillins    Social History Social History   Tobacco Use  . Smoking status: Former Smoker    Types: Cigarettes    Quit date: 05/16/1999    Years since quitting: 21.5  . Smokeless  tobacco: Never Used  Substance Use Topics  . Alcohol use: Yes    Comment: 2 bottles wine per month  . Drug use: Yes    Types: Marijuana    Review of Systems Patient denies headaches, rhinorrhea, blurry vision, numbness, shortness of breath, chest pain, edema, cough, abdominal pain, nausea, vomiting, diarrhea, dysuria, fevers, rashes or hallucinations unless otherwise stated above in HPI. ____________________________________________   PHYSICAL EXAM:  VITAL SIGNS: Vitals:   11/29/20 0700 11/29/20 0704  BP: (!) 173/126   Pulse: (!) 124   Resp: (!) 22 (!) 36  Temp: 99.6 F (37.6 C)   SpO2: 98%     Constitutional: Alert and oriented.  Eyes: Conjunctivae are normal.  Head: Atraumatic. Nose: No congestion/rhinnorhea. Mouth/Throat: Mucous membranes are moist.   Neck: No stridor. Painless ROM.  Cardiovascular: Normal rate, regular rhythm. Grossly normal heart sounds.  Good peripheral circulation. Respiratory: Normal respiratory effort.  No retractions. Lungs with posterior crackles Gastrointestinal: Soft and nontender. No distention. No abdominal bruits. No CVA tenderness. Genitourinary:  Musculoskeletal: No lower extremity tenderness nor edema.  No joint effusions. Neurologic:  Normal speech and language. No gross focal neurologic deficits are appreciated. No facial droop Skin:  Skin is warm, dry and intact. No rash noted. Psychiatric: Mood and affect are normal. Speech and behavior are normal.  ____________________________________________   LABS (all labs ordered are listed, but only abnormal results are displayed)  Results for orders placed or performed during the hospital encounter of  11/29/20 (from the past 24 hour(s))  Comprehensive metabolic panel     Status: Abnormal   Collection Time: 11/29/20  5:33 AM  Result Value Ref Range   Sodium 138 135 - 145 mmol/L   Potassium 4.0 3.5 - 5.1 mmol/L   Chloride 104 98 - 111 mmol/L   CO2 20 (L) 22 - 32 mmol/L   Glucose, Bld  355 (H) 70 - 99 mg/dL   BUN 13 6 - 20 mg/dL   Creatinine, Ser 8.91 0.44 - 1.00 mg/dL   Calcium 8.8 (L) 8.9 - 10.3 mg/dL   Total Protein 7.3 6.5 - 8.1 g/dL   Albumin 3.8 3.5 - 5.0 g/dL   AST 40 15 - 41 U/L   ALT 33 0 - 44 U/L   Alkaline Phosphatase 78 38 - 126 U/L   Total Bilirubin 0.7 0.3 - 1.2 mg/dL   GFR, Estimated >69 >45 mL/min   Anion gap 14 5 - 15  Lipase, blood     Status: None   Collection Time: 11/29/20  5:33 AM  Result Value Ref Range   Lipase 23 11 - 51 U/L  Resp Panel by RT-PCR (Flu A&B, Covid) Nasopharyngeal Swab     Status: Abnormal   Collection Time: 11/29/20  6:00 AM   Specimen: Nasopharyngeal Swab; Nasopharyngeal(NP) swabs in vial transport medium  Result Value Ref Range   SARS Coronavirus 2 by RT PCR POSITIVE (A) NEGATIVE   Influenza A by PCR NEGATIVE NEGATIVE   Influenza B by PCR NEGATIVE NEGATIVE  Lactic acid, plasma     Status: Abnormal   Collection Time: 11/29/20  6:00 AM  Result Value Ref Range   Lactic Acid, Venous 5.4 (HH) 0.5 - 1.9 mmol/L  CBC with Differential/Platelet     Status: Abnormal   Collection Time: 11/29/20  6:00 AM  Result Value Ref Range   WBC 15.5 (H) 4.0 - 10.5 K/uL   RBC 5.39 (H) 3.87 - 5.11 MIL/uL   Hemoglobin 16.1 (H) 12.0 - 15.0 g/dL   HCT 03.8 (H) 88.2 - 80.0 %   MCV 88.5 80.0 - 100.0 fL   MCH 29.9 26.0 - 34.0 pg   MCHC 33.8 30.0 - 36.0 g/dL   RDW 34.9 17.9 - 15.0 %   Platelets 438 (H) 150 - 400 K/uL   nRBC 0.0 0.0 - 0.2 %   Neutrophils Relative % 91 %   Neutro Abs 14.1 (H) 1.7 - 7.7 K/uL   Lymphocytes Relative 5 %   Lymphs Abs 0.7 0.7 - 4.0 K/uL   Monocytes Relative 4 %   Monocytes Absolute 0.6 0.1 - 1.0 K/uL   Eosinophils Relative 0 %   Eosinophils Absolute 0.0 0.0 - 0.5 K/uL   Basophils Relative 0 %   Basophils Absolute 0.0 0.0 - 0.1 K/uL   Immature Granulocytes 0 %   Abs Immature Granulocytes 0.06 0.00 - 0.07 K/uL   ____________________________________________  EKG My review and personal interpretation at Time:  5:22   Indication: n/v  Rate: 95  Rhythm: sinus Axis: normal Other: nonspecific st abn, no stemi criteria ____________________________________________  RADIOLOGY  I personally reviewed all radiographic images ordered to evaluate for the above acute complaints and reviewed radiology reports and findings.  These findings were personally discussed with the patient.  Please see medical record for radiology report.  ____________________________________________   PROCEDURES  Procedure(s) performed:  .Critical Care Performed by: Willy Eddy, MD Authorized by: Willy Eddy, MD   Critical care provider statement:  Critical care time (minutes):  35   Critical care time was exclusive of:  Separately billable procedures and treating other patients   Critical care was necessary to treat or prevent imminent or life-threatening deterioration of the following conditions:  Respiratory failure   Critical care was time spent personally by me on the following activities:  Development of treatment plan with patient or surrogate, discussions with consultants, evaluation of patient's response to treatment, examination of patient, obtaining history from patient or surrogate, ordering and performing treatments and interventions, ordering and review of laboratory studies, ordering and review of radiographic studies, pulse oximetry, re-evaluation of patient's condition and review of old charts      Critical Care performed: yes ____________________________________________   INITIAL IMPRESSION / ASSESSMENT AND PLAN / ED COURSE  Pertinent labs & imaging results that were available during my care of the patient were reviewed by me and considered in my medical decision making (see chart for details).   DDX: COVID, URI, enteritis, pneumonia, sepsis  Krystal Gregory is a 49 y.o. who presents to the ED with presentation as described above.  Patient is evidence of new O2 requirement with acute  hypoxia respiratory failure.  Her abdominal exam is soft and benign.  Chest x-ray concerning for multifocal pneumonia and given recent surgery and case is certainly concerning for COVID-19 infection.  Blood work symptoms but differential.  Will cover for community-acquired pneumonia.  Will give IV fluids as she felt like she was about to pass out and is dehydrated.  Anticipate patient will require hospitalization  Clinical Course as of 11/29/20 0715  Fri Nov 29, 2020  0648 Patient with fairly significant lactic acidemia.  We will continue with aggressive IV fluid resuscitation. [PR]  0706 Lymphocytes: 5 [PR]  1610 Patient is COVID-positive.  Will order Decadron as well as remdesivir.  Will discuss with hospitalist for admission. [PR]    Clinical Course User Index [PR] Merlyn Lot, MD    The patient was evaluated in Emergency Department today for the symptoms described in the history of present illness. He/she was evaluated in the context of the global COVID-19 pandemic, which necessitated consideration that the patient might be at risk for infection with the SARS-CoV-2 virus that causes COVID-19. Institutional protocols and algorithms that pertain to the evaluation of patients at risk for COVID-19 are in a state of rapid change based on information released by regulatory bodies including the CDC and federal and state organizations. These policies and algorithms were followed during the patient's care in the ED.  As part of my medical decision making, I reviewed the following data within the [facility] notes reviewed and incorporated, Labs reviewed, notes from prior ED visits and [place] Controlled Substance Database   ____________________________________________   FINAL CLINICAL IMPRESSION(S) / ED DIAGNOSES  Final diagnoses:  Acute respiratory failure with hypoxia ([place])  COVID-19 virus infection      NEW MEDICATIONS STARTED DURING THIS VISIT:  New  Prescriptions   No medications on file     Note:  This document was prepared using Dragon voice recognition software and may include unintentional dictation errors.    Merlyn Lot, MD 11/29/20 [phone]

## 2020-11-29 NOTE — ED Provider Notes (Signed)
-----------------------------------------   7:27 AM on 11/29/2020 -----------------------------------------  Blood pressure (!) 171/111, pulse (!) 111, temperature 99.6 F (37.6 C), temperature source Rectal, resp. rate (!) 26, height _0  (1.727 m), weight 88.4 kg, SpO2 97 %.  Assuming care from Dr. Quentin Cornwall.  In short, Krystal Gregory is a 50 y.o. female with a chief complaint of Shortness of Breath, Nausea, Vomiting, and Diarrhea .  Refer to the original H&P for additional details.  The current plan of care is to discuss with hospitalist for admission.  ----------------------------------------- 8:28 AM on 11/29/2020 -----------------------------------------  I was called to the bedside by patient's nurse that she was increasingly agitated with increasing difficulty breathing.  Patient extremely tachypneic and agitated on my assessment, which did not improve with BiPAP and Ativan.  Patient stated she felt like she was drowning with significant secretions and gurgling respirations.  Concern for flash pulmonary edema and she was treated with sublingual nitro.  Patient with increasingly poor respiratory effort and worsening somnolence, at which point decision was made to intubate.  Patient with copious white frothy secretions with intubation, potentially related to COVID-19 versus flash pulmonary edema.  We will recheck chest x-ray for tube positioning discussed with ICU for admission.  Chest x-ray reviewed by me and shows appropriate positioning of the ET tube.  Case discussed with ICU provider for admission.  Procedure Name: Intubation Date/Time: 11/29/2020 8:31 AM Performed by: Blake Divine, MD Pre-anesthesia Checklist: Patient identified, Patient being monitored, Emergency Drugs available, Timeout performed and Suction available Oxygen Delivery Method: Non-rebreather mask Preoxygenation: Pre-oxygenation with 100% oxygen Induction Type: Rapid sequence Ventilation: Mask ventilation  without difficulty Laryngoscope Size: Mac and 4 Grade View: Grade I Tube size: 7.5 mm Airway Equipment and Method: Video-laryngoscopy Placement Confirmation: ETT inserted through vocal cords under direct vision,  CO2 detector and Breath sounds checked- equal and bilateral    .Critical Care Performed by: Blake Divine, MD Authorized by: Blake Divine, MD   Critical care provider statement:    Critical care time (minutes):  45   Critical care time was exclusive of:  Separately billable procedures and treating other patients and teaching time   Critical care was necessary to treat or prevent imminent or life-threatening deterioration of the following conditions:  Respiratory failure   Critical care was time spent personally by me on the following activities:  Discussions with consultants, evaluation of patient's response to treatment, examination of patient, ordering and performing treatments and interventions, ordering and review of laboratory studies, ordering and review of radiographic studies, pulse oximetry, re-evaluation of patient's condition, obtaining history from patient or surrogate and review of old charts   I assumed direction of critical care for this patient from another provider in my specialty: yes     Care discussed with: admitting provider         Blake Divine, MD 11/29/20 1359

## 2020-11-29 NOTE — H&P (Signed)
Krystal Gregory, MRN:  [id number], DOB:  1971-07-19, LOS: 0 ADMISSION DATE:  11/29/2020, CONSULTATION DATE:  11/29/2020 REFERRING MD:  Dr. Charna Archer, CHIEF COMPLAINT:  Shortness of breath  Brief History:  50 y.o. Female with PMH of migraines admitted 11/29/20 with Acute Hypoxic Respiratory Failure in the setting of COVID-19 Pneumonia and Pulmonary Edema.  Failed trial of BiPAP requiring intubation in ED.  History of Present Illness:  Krystal Gregory is a 50 year old female with a past medical history notable for migraines who presents to [facility] ED on 11/29/2020 due to complaints of shortness of breath, cough nausea, vomiting, diarrhea, and generalized malaise.  Patient is currently intubated and sedated and no family present, therefore history is obtained from ED and nursing notes.  Per notes her symptoms began approximately 24 hours ago and rapidly progressed, and she was feeling as if she was about to pass out and was extremely weak.  She denied fever chills, chest pain, abdominal pain, edema.  ED course: Presentation to the ED her vitals included temperature 99.6 F rectally, respiratory rate 22, pulse 97, blood pressure 159/102, and 89% O2 saturations on room air.  Work-up is notable for glucose 355, lactic acid 5.4, WBC 15.5 with neutrophilia.  Her COVID-19 PCR is positive.  Influenza AMB PCR is negative.  Urinalysis is negative for urinary tract infection.  Chest x-ray with extensive airspace opacities to the right lung and ill-defined infiltrate to the left base.  She was to be admitted by the hospitalist for further work-up and treatment of acute hypoxic respiratory failure in the setting of COVID-19 pneumonia.  However while she was in the ED she developed acute respiratory distress along with increasing agitation.  Trial of BiPAP and Ativan attempted, however she stated she felt like she was drowning and was having gurgling respirations.  Concern was for flash pulmonary edema.  Given her  respiratory distress and worsening mentation decision was made to emergently intubate.  She was noted to have copious white frothy secretions with intubation.  Further work-up reveals trending BNP 755.9, and high sensitivity troponin 1341 which has trended upward to 2892, procalcitonin less than 0.10, fibrin derivative products 2670.  PCCM is consulted to admit the patient for further work-up and treatment of acute hypoxic respiratory failure in the setting of COVID-19 pneumonia and pulmonary edema, along with elevated troponin concerning for demand ischemia versus NSTEMI.  Cardiology has been consulted and she has been placed on heparin drip.  Given her rapid decline and elevated FDPs, will obtain CTA chest to rule out pulmonary embolism.  Past Medical History:  Migraines  Significant Hospital Events:  1/14: Presented to ED; rapidly deteriorated while in ED requiring emergent intubation  Consults:  PCCM Cardiology  Procedures:  1/14: Endotracheal intubation  Significant Diagnostic Tests:  1/14: CXR>>Dense consolidative and streaky opacities throughout the right lung with more mild opacities in the left lung base as well, can reflect a multifocal infection/pneumonia in the appropriate clinical Setting. 1/14: Echocardiogram>> 1/14: CTA Chest>>  Micro Data:  1/14: SARS-CoV-2 PCR>> Positive 1/14: Influenza A&B PCR>>negative 1/14: Blood culture x2>> 1/14: Urine>> 1/14: Tracheal aspirate>> 1/14: Strep pneumo urinary antigen>> 1/14: Legionella Urinary antigen>>  Antimicrobials:  Remdesivir 1/14>> Azithromycin 1/14>> Ceftriaxone 1/14>>  Interim History / Subjective:  Rapidly deteriorated earlier this morning (acute respiratory distress and AMS) requiring emergent intubation in ED, concern for flash pulmonary edema Currently intubated and sedated on Propofol Hemodynamically stable   Objective   Blood pressure (!) 214/100, pulse (!) 150,  temperature 99.1 F (37.3 C), resp. rate  (!) 31, height 5\' 8"  (1.727 m), weight 88.4 kg, SpO2 93 %.    Vent Mode: AC FiO2 (%):  [100 %] 100 % Set Rate:  [14 bmp] 14 bmp Vt Set:  [450 mL] 450 mL PEEP:  [10 cmH20] 10 cmH20  No intake or output data in the 24 hours ending 11/29/20 0936 Filed Weights   11/29/20 0525  Weight: 88.4 kg    Examination: General: Critically ill appearing female, laying in bed, intubated and sedated, in NAD HENT: Atraumatic, normocephalic, neck supple, no JVD Lungs: Unable to auscultate due to CAPR, vent assisted, tachypnea Cardiovascular: Tachycardia, regular rhythm, 2+ distal pulses Abdomen: Obese, soft, nontender, nondistended, no guarding or rebound tenderness Extremities: No deformties, 1+ bilateral LE edema Neuro: Sedated, withdraws from pain, pupils PERRL Skin: Warm and dry.  No obvious rashes, lesions, or ulcerations  Resolved Hospital Problem list   N/A  Assessment & Plan:   Acute Hypoxic Respiratory Failure in the setting of COVID-19 Pneumonia & Pulmonary Edema -Mechanical ventilation via ARDS protocol, target PRVC 6 cc/kg -Wean PEEP and FiO2 as able to maintain O2 sats >88% -Goal plateau pressure less than 30, driving pressure less than 15 -Paralytics if necessary for vent synchrony, gas exchange -Cycle prone positioning if necessary for oxygenation -Deep sedation per PAD protocol, goal RASS -4, currently fentanyl, midazolam -Diuresis as blood pressure and renal function can tolerate, goal CVP 5-8.   -VAP prevention order set -Remdesivir, plan for 5 days -Steroids (Solumedrol 60 mg BID) -Follow inflammatory markers: Ferritin, D-dimer, CRP, IL-6, LDH -Assess for possible tocilizumab  -Vitamin C, zinc -Plan to repeat and check resp cultures   Elevated Troponin, demand ischemia vs. NSTEMI Pulmonary Edema (no known history of CHF) Lactic acidosis Hypertension -Continuous cardiac monitoring -Maintain MAP >65 -Received 40 mg IV Lasix in ED -Further diuresis as BP and renal  function permits -Trend troponin -Trend lactic acid -Place on Heparin gtt -Cardiology consulted, appreciate input -Echocardiogram pending -Elevated FDP's (2670), discussed with Dr. Lanney Gins, will obtain CTA Chest to r/o Pulmonary Embolism   COVID-19 Pneumonia ? Superimposed Bacterial Pneumonia -Monitor fever curve -Trend WBC's & Procalcitonin -Follow cultures as above -Remdesivir, plan for 5 day course -No Baricitinib for now due to ?  bacterial PNA -Continue Azithromycin & Rocephin for now pending cultures & sensitivities -Maintain Airborne & Contact Precautions   Hyperglycemia -CBG's -SSI -Follow ICU Hypo/hyperglycemia protocol -Check Hgb A1c>> 6.1   Best practice (evaluated daily)  Diet: NPO Pain/Anxiety/Delirium protocol (if indicated): Fentanyl and propofol VAP protocol (if indicated): yes DVT prophylaxis: Heparin gtt GI prophylaxis: Pepcid Glucose control: SSI Mobility: Bedrest Disposition:ICU  Goals of Care:  Last date of multidisciplinary goals of care discussion:N/A Family and staff present: Updated pt's father Kyanna Mahrt via Telephone 11/30/19 Summary of discussion: Critical illness w/ covid requiring intubation, ? Superimposed bacterial PNA and PE Follow up goals of care discussion due: 11/30/20 Code Status: Full code  Labs   CBC: Recent Labs  Lab 11/29/20 0600  WBC 15.5*  NEUTROABS 14.1*  HGB 16.1*  HCT 47.7*  MCV 88.5  PLT 438*    Basic Metabolic Panel: Recent Labs  Lab 11/29/20 0533  NA 138  K 4.0  CL 104  CO2 20*  GLUCOSE 355*  BUN 13  CREATININE 0.71  CALCIUM 8.8*   GFR: Estimated Creatinine Clearance: 96.8 mL/min (by C-G formula based on SCr of 0.71 mg/dL). Recent Labs  Lab 11/29/20 0600 11/29/20 0730  WBC 15.5*  --  LATICACIDVEN 5.4* 4.0*    Liver Function Tests: Recent Labs  Lab 11/29/20 0533  AST 40  ALT 33  ALKPHOS 78  BILITOT 0.7  PROT 7.3  ALBUMIN 3.8   Recent Labs  Lab 11/29/20 0533  LIPASE 23    No results for input(s): AMMONIA in the last 168 hours.  ABG No results found for: PHART, PCO2ART, PO2ART, HCO3, TCO2, ACIDBASEDEF, O2SAT   Coagulation Profile: No results for input(s): INR, PROTIME in the last 168 hours.  Cardiac Enzymes: No results for input(s): CKTOTAL, CKMB, CKMBINDEX, TROPONINI in the last 168 hours.  HbA1C: No results found for: HGBA1C  CBG: No results for input(s): GLUCAP in the last 168 hours.  Review of Systems:   Unable to assess due to intubation, sedation, and critical illness  Past Medical History:  She,  has a past medical history of Migraines and Migraines.   Surgical History:  History reviewed. No pertinent surgical history.   Social History:   reports that she quit smoking about 21 years ago. Her smoking use included cigarettes. She has never used smokeless tobacco. She reports current alcohol use. She reports current drug use. Drug: Marijuana.   Family History:  Her family history is not on file.   Allergies Allergies  Allergen Reactions  . Penicillins Rash     Home Medications  Prior to Admission medications   Medication Sig Start Date End Date Taking? Authorizing Provider  diphenhydrAMINE (BENADRYL) 25 mg capsule Take 100 mg by mouth every 6 (six) hours as needed. Sleep and congestion    [provider]  ibuprofen (ADVIL,MOTRIN) 800 MG tablet Take 1 tablet (800 mg total) by mouth 3 (three) times daily. 11/29/12   Kirichenko, Lahoma Rocker, PA-C  SUMAtriptan (IMITREX) 100 MG tablet Take 100 mg by mouth every 2 (two) hours as needed. Migraines    [provider]     Critical care time: 60 minutes    Darel Hong, [facility] Pager: [phone]

## 2020-11-29 NOTE — Progress Notes (Signed)
Initial Nutrition Assessment  DOCUMENTATION CODES:   Not applicable  INTERVENTION:  Once patient stable enough for initiation of tube feeds recommend:  -Vital 1.5 Cal at 40 mL/hr (960 mL goal daily volume) per tube -PROSource TF 90 mL TID per tube -Goal regimen provides 1680 kcal, 131 grams of protein, 730 mL H2O daily. With current propofol rate provides 1820 kcal daily. -MVI daily per tube  NUTRITION DIAGNOSIS:   Inadequate oral intake related to inability to eat as evidenced by NPO status.  GOAL:   Patient will meet greater than or equal to 90% of their needs  MONITOR:   Vent status,Labs,Weight trends,TF tolerance,I & O's  REASON FOR ASSESSMENT:   Ventilator    ASSESSMENT:   50 year old female with PMHx of migraines admitted with acute hypoxic respiratory failure in setting of COVID-19 PNA and pulmonary edema, also with NSTEMI.   1/14 intubated  Patient is currently intubated on ventilator support MV: 9.94 L/min Temp (24hrs), Avg:100.1 F (37.8 C), Min:97.3 F (36.3 C), Max:100.8 F (38.2 C)  Propofol: 5.3 ml/hr (140 kcal daily)  Medications reviewed and include: vitamin C 500 g daily, Colace 100 mg BID, Novolog 0-15 units Q4hrs, Solu-medrol 60 mg Q12hrs, Miralax, zinc sulfate 220 mg daily, azithromycin, ceftriaxone, famotidine, fentanyl gtt, norepinephrine gtt at 12 mcg/min, propofol gtt, remdesivir.  Labs reviewed: CBG 285-302, CO2 20.  I/O: 1635 mL UOP so far today  Enteral Access: OGT placed 1/14; terminates in stomach per abdominal x-ray 1/14  No plans to initiate tube feeds at this time as patient just arrived from ED to ICU and is on increasing pressor dose.  NUTRITION - FOCUSED PHYSICAL EXAM:  Flowsheet Row Most Recent Value  Orbital Region No depletion  Upper Arm Region No depletion  Thoracic and Lumbar Region No depletion  Buccal Region Unable to assess  Temple Region No depletion  Clavicle Bone Region No depletion  Clavicle and Acromion  Bone Region No depletion  Scapular Bone Region Unable to assess  Dorsal Hand No depletion  Patellar Region No depletion  Anterior Thigh Region No depletion  Posterior Calf Region No depletion  Edema (RD Assessment) None  Hair Reviewed  Eyes Unable to assess  Mouth Unable to assess  Skin Reviewed  Nails Reviewed     Diet Order:   Diet Order            Diet NPO time specified  Diet effective now                EDUCATION NEEDS:   No education needs have been identified at this time  Skin:  Skin Assessment: Reviewed RN Assessment  Last BM:  Unknown  Height:   Ht Readings from Last 1 Encounters:  11/29/20 5\' 8"  (1.727 m)   Weight:   Wt Readings from Last 1 Encounters:  11/29/20 88.4 kg   Ideal Body Weight:  63.6 kg  BMI:  Body mass index is 29.63 kg/m.  Estimated Nutritional Needs:   Kcal:  1697  Protein:  130-140 grams  Fluid:  >/= 2 L/day  Jacklynn Barnacle, MS, RD, LDN Pager number available on Amion

## 2020-11-29 NOTE — Progress Notes (Signed)
Pt is now hypotensive requiring vasopressors.  Needs CVC placed and possible Arterial line.  Called to get consent for placement of lines from pt's father, however no answer.  Left Voicemail for callback.  If unable to reach him in the near future, will place lines emergently.   Darel Hong, AGACNP-BC [facility] Pager: [phone]

## 2020-11-30 ENCOUNTER — Inpatient Hospital Stay: Payer: HRSA Program

## 2020-11-30 DIAGNOSIS — I214 Non-ST elevation (NSTEMI) myocardial infarction: Secondary | ICD-10-CM

## 2020-11-30 DIAGNOSIS — I428 Other cardiomyopathies: Secondary | ICD-10-CM

## 2020-11-30 LAB — CBC WITH DIFFERENTIAL/PLATELET
Abs Immature Granulocytes: 0.19 10*3/uL — ABNORMAL HIGH (ref 0.00–0.07)
Basophils Absolute: 0 10*3/uL (ref 0.0–0.1)
Basophils Relative: 0 %
Eosinophils Absolute: 0 10*3/uL (ref 0.0–0.5)
Eosinophils Relative: 0 %
HCT: 44.3 % (ref 36.0–46.0)
Hemoglobin: 15.1 g/dL — ABNORMAL HIGH (ref 12.0–15.0)
Immature Granulocytes: 1 %
Lymphocytes Relative: 5 %
Lymphs Abs: 1.3 10*3/uL (ref 0.7–4.0)
MCH: 30.1 pg (ref 26.0–34.0)
MCHC: 34.1 g/dL (ref 30.0–36.0)
MCV: 88.4 fL (ref 80.0–100.0)
Monocytes Absolute: 0.1 10*3/uL (ref 0.1–1.0)
Monocytes Relative: 1 %
Neutro Abs: 24.3 10*3/uL — ABNORMAL HIGH (ref 1.7–7.7)
Neutrophils Relative %: 93 %
Platelets: 441 10*3/uL — ABNORMAL HIGH (ref 150–400)
RBC: 5.01 MIL/uL (ref 3.87–5.11)
RDW: 13.8 % (ref 11.5–15.5)
Smear Review: NORMAL
WBC: 25.9 10*3/uL — ABNORMAL HIGH (ref 4.0–10.5)
nRBC: 0 % (ref 0.0–0.2)

## 2020-11-30 LAB — COMPREHENSIVE METABOLIC PANEL
ALT: 25 U/L (ref 0–44)
AST: 33 U/L (ref 15–41)
Albumin: 2.9 g/dL — ABNORMAL LOW (ref 3.5–5.0)
Alkaline Phosphatase: 66 U/L (ref 38–126)
Anion gap: 11 (ref 5–15)
BUN: 24 mg/dL — ABNORMAL HIGH (ref 6–20)
CO2: 24 mmol/L (ref 22–32)
Calcium: 8.5 mg/dL — ABNORMAL LOW (ref 8.9–10.3)
Chloride: 104 mmol/L (ref 98–111)
Creatinine, Ser: 1.03 mg/dL — ABNORMAL HIGH (ref 0.44–1.00)
GFR, Estimated: >60 mL/min (ref >60)
Glucose, Bld: 342 mg/dL — ABNORMAL HIGH (ref 70–99)
Potassium: 4 mmol/L (ref 3.5–5.1)
Sodium: 139 mmol/L (ref 135–145)
Total Bilirubin: 0.6 mg/dL (ref 0.3–1.2)
Total Protein: 6 g/dL — ABNORMAL LOW (ref 6.5–8.1)

## 2020-11-30 LAB — GLUCOSE, CAPILLARY
Glucose-Capillary: 336 mg/dL — ABNORMAL HIGH (ref 70–99)
Glucose-Capillary: 179 mg/dL — ABNORMAL HIGH (ref 70–99)
Glucose-Capillary: 165 mg/dL — ABNORMAL HIGH (ref 70–99)
Glucose-Capillary: 153 mg/dL — ABNORMAL HIGH (ref 70–99)
Glucose-Capillary: 204 mg/dL — ABNORMAL HIGH (ref 70–99)
Glucose-Capillary: 131 mg/dL — ABNORMAL HIGH (ref 70–99)

## 2020-11-30 LAB — PROCALCITONIN: Procalcitonin: 1.19 ng/mL

## 2020-11-30 LAB — HEPARIN LEVEL (UNFRACTIONATED)
Heparin Unfractionated: 0.46 IU/mL (ref 0.30–0.70)
Heparin Unfractionated: 0.66 IU/mL (ref 0.30–0.70)

## 2020-11-30 LAB — MAGNESIUM: Magnesium: 1.8 mg/dL (ref 1.7–2.4)

## 2020-11-30 LAB — PHOSPHORUS: Phosphorus: 1.8 mg/dL — ABNORMAL LOW (ref 2.5–4.6)

## 2020-11-30 LAB — C-REACTIVE PROTEIN: CRP: 7.9 mg/dL — ABNORMAL HIGH (ref <1.0)

## 2020-11-30 LAB — FERRITIN: Ferritin: 121 ng/mL (ref 11–307)

## 2020-11-30 MED ORDER — METHYLPREDNISOLONE SODIUM SUCC 40 MG IJ SOLR
40.0000 mg | Freq: Two times a day (BID) | INTRAMUSCULAR | Status: DC
  Administered 2020-11-30 – 2020-12-02 (×5): 40 mg via INTRAVENOUS
  Filled 2020-11-30 (×5): qty 1

## 2020-11-30 MED ORDER — ASPIRIN 300 MG RE SUPP: 300.0000 mg | Freq: Every day | RECTAL | Status: DC

## 2020-11-30 MED ORDER — ZINC SULFATE 220 (50 ZN) MG PO CAPS
220.0000 mg | ORAL_CAPSULE | Freq: Every day | ORAL | Status: DC
  Administered 2020-11-30 – 2020-12-03 (×4): 220 mg
  Filled 2020-11-30 (×4): qty 1

## 2020-11-30 MED ORDER — DOCUSATE SODIUM 50 MG/5ML PO LIQD
100.0000 mg | Freq: Two times a day (BID) | ORAL | Status: DC | PRN
  Filled 2020-11-30: qty 10

## 2020-11-30 MED ORDER — ATORVASTATIN CALCIUM 20 MG PO TABS
40.0000 mg | ORAL_TABLET | Freq: Every day | ORAL | Status: DC
  Administered 2020-11-30 – 2020-12-03 (×4): 40 mg via ORAL
  Filled 2020-11-30 (×4): qty 2

## 2020-11-30 MED ORDER — ACETAMINOPHEN 325 MG PO TABS: 650.0000 mg | ORAL_TABLET | Freq: Four times a day (QID) | ORAL | Status: DC | PRN

## 2020-11-30 MED ORDER — POTASSIUM & SODIUM PHOSPHATES 280-160-250 MG PO PACK
1.0000 | PACK | Freq: Three times a day (TID) | ORAL | Status: AC
  Administered 2020-11-30 (×2): 1
  Filled 2020-11-30 (×2): qty 1

## 2020-11-30 MED ORDER — ACETAMINOPHEN 325 MG PO TABS
650.0000 mg | ORAL_TABLET | ORAL | Status: DC | PRN
  Administered 2020-11-30 – 2020-12-01 (×2): 650 mg via ORAL
  Filled 2020-11-30 (×2): qty 2

## 2020-11-30 MED ORDER — INSULIN ASPART 100 UNIT/ML ~~LOC~~ SOLN
0.0000 [IU] | SUBCUTANEOUS | Status: DC
  Administered 2020-11-30: 08:00:00 4 [IU] via SUBCUTANEOUS
  Administered 2020-11-30: 20:00:00 7 [IU] via SUBCUTANEOUS
  Administered 2020-11-30 – 2020-12-01 (×3): 4 [IU] via SUBCUTANEOUS
  Administered 2020-12-01 (×2): 3 [IU] via SUBCUTANEOUS
  Administered 2020-12-01 (×3): 4 [IU] via SUBCUTANEOUS
  Administered 2020-12-02: 7 [IU] via SUBCUTANEOUS
  Administered 2020-12-02 (×2): 4 [IU] via SUBCUTANEOUS
  Administered 2020-12-02 (×2): 7 [IU] via SUBCUTANEOUS
  Administered 2020-12-02: 4 [IU] via SUBCUTANEOUS
  Administered 2020-12-02: 3 [IU] via SUBCUTANEOUS
  Administered 2020-12-03 (×2): 7 [IU] via SUBCUTANEOUS
  Filled 2020-11-30 (×19): qty 1

## 2020-11-30 MED ORDER — MAGNESIUM SULFATE 2 GM/50ML IV SOLN
2.0000 g | Freq: Once | INTRAVENOUS | Status: AC
  Administered 2020-11-30: 2 g via INTRAVENOUS
  Filled 2020-11-30: qty 50

## 2020-11-30 MED ORDER — INSULIN ASPART 100 UNIT/ML IV SOLN
10.0000 [IU] | Freq: Once | INTRAVENOUS | Status: AC
  Administered 2020-11-30: 10 [IU] via INTRAVENOUS
  Filled 2020-11-30: qty 0.1

## 2020-11-30 MED ORDER — FUROSEMIDE 10 MG/ML IJ SOLN
INTRAMUSCULAR | Status: AC
  Administered 2020-11-30: 20 mg via INTRAVENOUS
  Filled 2020-11-30: qty 2

## 2020-11-30 MED ORDER — METHOCARBAMOL 1000 MG/10ML IJ SOLN
500.0000 mg | Freq: Once | INTRAVENOUS | Status: AC
  Administered 2020-11-30: 500 mg via INTRAVENOUS
  Filled 2020-11-30: qty 5

## 2020-11-30 MED ORDER — ASPIRIN 81 MG PO CHEW
324.0000 mg | CHEWABLE_TABLET | Freq: Every day | ORAL | Status: DC
  Administered 2020-11-30: 324 mg via ORAL
  Filled 2020-11-30: qty 4

## 2020-11-30 MED ORDER — POTASSIUM & SODIUM PHOSPHATES 280-160-250 MG PO PACK
1.0000 | PACK | Freq: Three times a day (TID) | ORAL | Status: DC
  Administered 2020-11-30: 1 via ORAL
  Filled 2020-11-30 (×2): qty 1

## 2020-11-30 MED ORDER — ASCORBIC ACID 500 MG PO TABS
500.0000 mg | ORAL_TABLET | Freq: Every day | ORAL | Status: DC
  Administered 2020-11-30 – 2020-12-03 (×4): 500 mg
  Filled 2020-11-30 (×4): qty 1

## 2020-11-30 MED ORDER — POLYETHYLENE GLYCOL 3350 17 G PO PACK: 17.0000 g | PACK | Freq: Every day | ORAL | Status: DC | PRN

## 2020-11-30 MED ORDER — FUROSEMIDE 10 MG/ML IJ SOLN: 20.0000 mg | Freq: Once | INTRAMUSCULAR | Status: AC

## 2020-11-30 NOTE — Progress Notes (Signed)
Pt extubated without complications no stridor noted, placed on BIPAP after extubation, tolerating well.

## 2020-11-30 NOTE — Progress Notes (Signed)
Progress Note  Due to the COVID-19 pandemic, this visit was completed with telemedicine (audio/video) technology to reduce patient and provider exposure as well as to preserve personal protective equipment.   Patient Name: Krystal Gregory Date of Encounter: 11/30/2020  Primary Cardiologist: Kathlyn Sacramento, MD   Subjective   Patient intubated, undergoing weaning trials.  Extubation planned later today.  Inpatient Medications    Scheduled Meds: . vitamin C  500 mg Per Tube Daily  . aspirin  324 mg Oral Daily   Or  . aspirin  300 mg Rectal Daily  . chlorhexidine  15 mL Mouth Rinse BID  . Chlorhexidine Gluconate Cloth  6 each Topical Daily  . docusate  100 mg Per Tube BID  . guaiFENesin  5 mL Oral Once  . insulin aspart  0-20 Units Subcutaneous Q4H  . mouth rinse  15 mL Mouth Rinse q12n4p  . methylPREDNISolone (SOLU-MEDROL) injection  40 mg Intravenous Q12H  . polyethylene glycol  17 g Per Tube Daily  . potassium & sodium phosphates  1 packet Per Tube Q8H  . zinc sulfate  220 mg Per Tube Daily   Continuous Infusions: . sodium chloride 250 mL (11/30/20 0050)  . azithromycin Stopped (11/30/20 0856)  . cefTRIAXone (ROCEPHIN)  IV Stopped (11/30/20 [id number])  . famotidine (PEPCID) IV Stopped (11/30/20 1020)  . fentaNYL infusion INTRAVENOUS 50 mcg/hr (11/30/20 1140)  . heparin 1,050 Units/hr (11/30/20 1140)  . norepinephrine Stopped (11/30/20 1021)  . propofol (DIPRIVAN) infusion 10 mcg/kg/min (11/30/20 1140)  . remdesivir 100 mg in NS 100 mL Stopped (11/30/20 1028)  . sodium chloride     PRN Meds: acetaminophen, albuterol, dextromethorphan-guaiFENesin, docusate, fentaNYL, fentaNYL (SUBLIMAZE) injection, fentaNYL (SUBLIMAZE) injection, hydrALAZINE, loperamide, midazolam, midazolam, ondansetron (ZOFRAN) IV, polyethylene glycol, promethazine, vecuronium   Vital Signs    Vitals:   11/30/20 1100 11/30/20 1200 11/30/20 1300 11/30/20 1312  BP:  (!) 141/79    Pulse: 70 72 97   Resp:  14 13 12    Temp: 98.96 F (37.2 C) 98.24 F (36.8 C) 98.24 F (36.8 C)   TempSrc:      SpO2: 92% 95% 93% 94%  Weight:      Height:        Intake/Output Summary (Last 24 hours) at 11/30/2020 1501 Last data filed at 11/30/2020 1140 Gross per 24 hour  Intake 2270.29 ml  Output 725 ml  Net 1545.29 ml   Last 3 Weights 11/30/2020 11/29/2020 11/26/2017  Weight (lbs) 199 lb 15.3 oz 194 lb 14.2 oz 255 lb  Weight (kg) 90.7 kg 88.4 kg 115.667 kg      Telemetry    Sinus rhythm- Personally Reviewed  ECG    No new tracing- Personally Reviewed  Physical Exam   VITAL SIGNS:  reviewed GEN:  no acute distress  Labs    Chemistry Recent Labs  Lab 11/29/20 0533 11/30/20 0220  NA 138 139  K 4.0 4.0  CL 104 104  CO2 20* 24  GLUCOSE 355* 342*  BUN 13 24*  CREATININE 0.71 1.03*  CALCIUM 8.8* 8.5*  PROT 7.3 6.0*  ALBUMIN 3.8 2.9*  AST 40 33  ALT 33 25  ALKPHOS 78 66  BILITOT 0.7 0.6  GFRNONAA >60 >60  ANIONGAP 14 11     Hematology Recent Labs  Lab 11/29/20 0600 11/30/20 0510  WBC 15.5* 25.9*  RBC 5.39* 5.01  HGB 16.1* 15.1*  HCT 47.7* 44.3  MCV 88.5 88.4  MCH 29.9 30.1  MCHC 33.8  34.1  RDW 13.2 13.8  PLT 438* 441*    Cardiac EnzymesNo results for input(s): TROPONINI in the last 168 hours. No results for input(s): TROPIPOC in the last 168 hours.   BNP Recent Labs  Lab 11/29/20 0600  BNP 755.9*     DDimer No results for input(s): DDIMER in the last 168 hours.   Radiology    DG Chest 1 View  Result Date: 11/29/2020 CLINICAL DATA:  Shortness of breath EXAM: CHEST  1 VIEW COMPARISON:  Radiograph 01/16/2007 FINDINGS: Dense consolidative and streaky opacities are seen throughout the right lung with more mild opacities in the left lung base as well. No pneumothorax. No visible effusion. Pulmonary vascularity remains fairly well defined. Stable cardiomediastinal contours. No acute osseous or soft tissue abnormality. IMPRESSION: Dense consolidative and streaky  opacities throughout the right lung with more mild opacities in the left lung base as well, can reflect a multifocal infection/pneumonia in the appropriate clinical setting. Electronically Signed   By: Lovena Le M.D.   On: 11/29/2020 05:54   DG Abdomen 1 View  Result Date: 11/29/2020 CLINICAL DATA:  Orogastric tube placement EXAM: ABDOMEN - 1 VIEW COMPARISON:  None. FINDINGS: Orogastric tube tip and side port are in the stomach. There is no bowel dilatation or air-fluid level to suggest bowel obstruction. No free air. Intrauterine device in mid pelvis. IMPRESSION: Orogastric tube tip and side port in stomach. No evident bowel obstruction or free air. Intrauterine device in mid pelvis. Electronically Signed   By: Lowella Grip III M.D.   On: 11/29/2020 08:57   CT ANGIO CHEST PE W OR WO CONTRAST  Result Date: 11/29/2020 CLINICAL DATA:  Positive D-dimer. Shortness of breath. Coronavirus infection. EXAM: CT ANGIOGRAPHY CHEST WITH CONTRAST TECHNIQUE: Multidetector CT imaging of the chest was performed using the standard protocol during bolus administration of intravenous contrast. Multiplanar CT image reconstructions and MIPs were obtained to evaluate the vascular anatomy. CONTRAST:  74mL OMNIPAQUE IOHEXOL 350 MG/ML SOLN COMPARISON:  Chest radiography earlier same day FINDINGS: Cardiovascular: Heart size is normal. No pericardial effusion. Pulmonary arterial opacification is excellent. No pulmonary emboli. No aortic atherosclerotic calcification or coronary artery calcification. Mediastinum/Nodes: No mediastinal or hilar mass or lymphadenopathy. Lungs/Pleura: Small bilateral pleural effusions. Areas of patchy pulmonary consolidation including dependent consolidation in both lungs. Pattern is somewhat atypical for coronavirus infection alone and there could be coronavirus infection with superimposed bacterial pneumonia. No pneumothorax. Upper Abdomen: Multiple gallstones dependent in the gallbladder.  Orogastric tube enters the stomach. Musculoskeletal: Normal Review of the MIP images confirms the above findings. IMPRESSION: 1. No pulmonary emboli. 2. Small bilateral pleural effusions. Areas of patchy pulmonary consolidation including dependent consolidation in both lungs. Pattern is somewhat atypical for coronavirus infection alone and there could be superimposed bacterial pneumonia. 3. Chololithiasis. Electronically Signed   By: Nelson Chimes M.D.   On: 11/29/2020 14:29   DG Chest Port 1 View  Result Date: 11/30/2020 CLINICAL DATA:  Respiratory failure EXAM: PORTABLE CHEST 1 VIEW COMPARISON:  November 29, 2020 FINDINGS: There are persistent hazy bilateral airspace opacities. The lines and tubes are essentially stable in appearance. There is no pneumothorax. There are small bilateral pleural effusions. The heart size is stable. IMPRESSION: 1. Lines and tubes as above. 2. Persistent hazy bilateral airspace opacities and small bilateral pleural effusions. Overall exam is not significantly changed from 1 day prior. Electronically Signed   By: Constance Holster M.D.   On: 11/30/2020 05:13   DG Chest [facility]  Result Date: 11/29/2020 CLINICAL DATA:  Encounter for central line placement EXAM: PORTABLE CHEST 1 VIEW COMPARISON:  Chest x-ray 11/29/2020 8:56 a.m., CT chest 11/29/2020 FINDINGS: Interval placement of a left internal jugular central venous catheter with tip overlying the expected region of the superior cavoatrial junction. Endotracheal tube in grossly similar position with tip terminating 5.5 cm above the carina. Enteric tube noted coursing below diaphragm with tip and side port collimated off view. The heart size and mediastinal contours are unchanged. Persistent diffuse central patchy airspace opacities that are worse on the right compared to the left. No pleural effusion. No pneumothorax. No acute osseous abnormality. IMPRESSION: 1. Interval placement of a left internal jugular central venous  catheter with tip overlying the expected region of the superior cavoatrial junction. Other lines and tubes are in stable position. 2. Persistent diffuse central patchy airspace opacities, right greater than left. Electronically Signed   By: Iven Finn M.D.   On: 11/29/2020 18:18   DG Chest Portable 1 View  Result Date: 11/29/2020 CLINICAL DATA:  Hypoxia EXAM: PORTABLE CHEST 1 VIEW COMPARISON:  November 29, 2020 study obtained earlier in the day FINDINGS: Endotracheal tube tip is 4.6 cm above the carina. Nasogastric tube tip and side port are below the diaphragm. No pneumothorax. There is airspace consolidation throughout much of the right lung, increased from earlier in the day. There is airspace opacity in the medial left base. There is mild cardiomegaly with pulmonary venous hypertension. No adenopathy evident. IMPRESSION: Tube positions as described without pneumothorax. Extensive airspace opacity throughout much of the right lung, slightly increased. Ill-defined infiltrate left base. There is a degree of cardiomegaly with pulmonary vascular congestion. Electronically Signed   By: Lowella Grip III M.D.   On: 11/29/2020 08:56   ECHOCARDIOGRAM COMPLETE  Result Date: 11/29/2020    ECHOCARDIOGRAM REPORT   Patient Name:   Krystal Gregory Date of Exam: 11/29/2020 Medical Rec #:  [id number]       Height:       68.0 in Accession #:    [id number]      Weight:       194.9 lb Date of Birth:  Nov 08, 1971       BSA:          2.021 m Patient Age:    50 years        BP:           101/81 mmHg Patient Gender: F               HR:           136 bpm. Exam Location:  [facility] Procedure: 2D Echo, Cardiac Doppler and Color Doppler Indications:     Elevated Troponin  History:         Patient has no prior history of Echocardiogram examinations.                  Migraines.  Sonographer:     Sherrie Sport RDCS (AE) Referring Phys:  Kathlyn Sacramento MD Diagnosing Phys: Kathlyn Sacramento MD  Sonographer Comments: Echo performed with  patient supine and on artificial respirator. IMPRESSIONS  1. Left ventricular ejection fraction, by estimation, is 20 to 25%. The left ventricle has severely decreased function. The left ventricle demonstrates global hypokinesis. Left ventricular diastolic parameters are indeterminate.  2. Right ventricular systolic function is normal. The right ventricular size is normal. Tricuspid regurgitation signal is inadequate for assessing PA pressure.  3. The mitral valve is normal in  structure. No evidence of mitral valve regurgitation. No evidence of mitral stenosis.  4. The aortic valve is normal in structure. Aortic valve regurgitation is not visualized. No aortic stenosis is present.  5. The inferior vena cava is dilated in size with <50% respiratory variability, suggesting right atrial pressure of 15 mmHg. FINDINGS  Left Ventricle: Left ventricular ejection fraction, by estimation, is 20 to 25%. The left ventricle has severely decreased function. The left ventricle demonstrates global hypokinesis. The left ventricular internal cavity size was normal in size. There is no left ventricular hypertrophy. Left ventricular diastolic parameters are indeterminate. Right Ventricle: The right ventricular size is normal. No increase in right ventricular wall thickness. Right ventricular systolic function is normal. Tricuspid regurgitation signal is inadequate for assessing PA pressure. Left Atrium: Left atrial size was normal in size. Right Atrium: Right atrial size was normal in size. Pericardium: There is no evidence of pericardial effusion. Mitral Valve: The mitral valve is normal in structure. No evidence of mitral valve regurgitation. No evidence of mitral valve stenosis. Tricuspid Valve: The tricuspid valve is normal in structure. Tricuspid valve regurgitation is not demonstrated. No evidence of tricuspid stenosis. Aortic Valve: The aortic valve is normal in structure. Aortic valve regurgitation is not visualized. No aortic  stenosis is present. Aortic valve mean gradient measures 3.0 mmHg. Aortic valve peak gradient measures 4.8 mmHg. Aortic valve area, by VTI measures 1.78 cm. Pulmonic Valve: The pulmonic valve was normal in structure. Pulmonic valve regurgitation is not visualized. No evidence of pulmonic stenosis. Aorta: The aortic root is normal in size and structure. Venous: The inferior vena cava is dilated in size with less than 50% respiratory variability, suggesting right atrial pressure of 15 mmHg. IAS/Shunts: No atrial level shunt detected by color flow Doppler.  LEFT VENTRICLE PLAX 2D LVIDd:         4.90 cm      Diastology LVIDs:         4.60 cm      LV e' medial:    4.24 cm/s LV PW:         1.20 cm      LV E/e' medial:  12.7 LV IVS:        1.00 cm      LV e' lateral:   4.35 cm/s LVOT diam:     2.10 cm      LV E/e' lateral: 12.4 LV SV:         22 LV SV Index:   11 LVOT Area:     3.46 cm  LV Volumes (MOD) LV vol d, MOD A2C: 104.0 ml LV vol d, MOD A4C: 116.0 ml LV vol s, MOD A2C: 76.0 ml LV vol s, MOD A4C: 85.7 ml LV SV MOD A2C:     28.0 ml LV SV MOD A4C:     116.0 ml LV SV MOD BP:      30.1 ml RIGHT VENTRICLE RV S prime:     10.40 cm/s TAPSE (M-mode): 3.0 cm LEFT ATRIUM             Index       RIGHT ATRIUM          Index LA diam:        3.50 cm 1.73 cm/m  RA Area:     9.98 cm LA Vol (A2C):   40.2 ml 19.89 ml/m RA Volume:   21.40 ml 10.59 ml/m LA Vol (A4C):   18.3 ml 9.05 ml/m LA Biplane Vol: 28.7  ml 14.20 ml/m  AORTIC VALVE                   PULMONIC VALVE AV Area (Vmax):    1.62 cm    PV Vmax:        0.82 m/s AV Area (Vmean):   1.32 cm    PV Peak grad:   2.7 mmHg AV Area (VTI):     1.78 cm    RVOT Peak grad: 6 mmHg AV Vmax:           110.00 cm/s AV Vmean:          83.000 cm/s AV VTI:            0.125 m AV Peak Grad:      4.8 mmHg AV Mean Grad:      3.0 mmHg LVOT Vmax:         51.60 cm/s LVOT Vmean:        31.600 cm/s LVOT VTI:          0.064 m LVOT/AV VTI ratio: 0.51  AORTA Ao Root diam: 3.20 cm MITRAL VALVE MV  Area (PHT): 10.68 cm   SHUNTS MV Decel Time: 71 msec     Systemic VTI:  0.06 m MV E velocity: 54.00 cm/s  Systemic Diam: 2.10 cm MV A velocity: 80.10 cm/s MV E/A ratio:  0.67 Kathlyn Sacramento MD Electronically signed by Kathlyn Sacramento MD Signature Date/Time: 11/29/2020/3:20:02 PM    Final     Cardiac Studies   TTE 11/29/2020 1. Left ventricular ejection fraction, by estimation, is 20 to 25%. The  left ventricle has severely decreased function. The left ventricle  demonstrates global hypokinesis. Left ventricular diastolic parameters are  indeterminate.  2. Right ventricular systolic function is normal. The right ventricular  size is normal. Tricuspid regurgitation signal is inadequate for assessing  PA pressure.  3. The mitral valve is normal in structure. No evidence of mitral valve  regurgitation. No evidence of mitral stenosis.  4. The aortic valve is normal in structure. Aortic valve regurgitation is  not visualized. No aortic stenosis is present.  5. The inferior vena cava is dilated in size with <50% respiratory  variability, suggesting right atrial pressure of 15 mmHg.   Patient Profile     50 y.o. female with history of migraines presenting to the hospital with shortness of breath, dizziness.  Diagnosed with respiratory failure in the setting of COVID-19 pneumonia.  Being seen for NSTEMI and severely reduced ejection fraction   Assessment & Plan    1. NSTEMI -Aspirin, start Lipitor 40 mg daily -Continue heparin drip to complete 48-hour course (945am tomorrow) -Plan for right and left heart cath after pulmonary status improves.  2.  Cardiomyopathy, EF 20 to 25% -Plan for right and left heart heart cath after resolution of COVID-19 pneumonia -Low blood pressures preventing addition of CHF meds at this point -Continue supportive care  3. COVID-19 pneumonia -Management as per critical care team  Total encounter time 35 minutes  Greater than 50% was spent in counseling  and coordination of care with the patient  Signed, Kate Sable, MD  11/30/2020, 3:01 PM

## 2020-11-30 NOTE — Progress Notes (Signed)
[place] for heparin Indication: chest pain/ACS  Allergies  Allergen Reactions  . Penicillins Rash    Patient Measurements: Height: 5\' 8"  (172.7 cm) Weight: 90.7 kg (199 lb 15.3 oz) IBW/kg (Calculated) : 63.9 Heparin Dosing Weight: 82.4 kg  Vital Signs: Temp: 99.5 F (37.5 C) (01/15 0800) BP: 98/63 (01/15 0800) Pulse Rate: 72 (01/15 0800)  Labs: Recent Labs    11/29/20 0533 11/29/20 0600 11/29/20 0600 11/29/20 0926 11/29/20 1135 11/29/20 1318 11/29/20 1724 11/30/20 0106 11/30/20 0220 11/30/20 0510  HGB  --  16.1*  --   --   --   --   --   --   --  15.1*  HCT  --  47.7*  --   --   --   --   --   --   --  44.3  PLT  --  438*  --   --   --   --   --   --   --  441*  APTT  --   --   --  30  --   --   --   --   --   --   LABPROT  --   --   --  12.7  --   --   --   --   --   --   INR  --   --   --  1.0  --   --   --   --   --   --   HEPARINUNFRC  --   --   --   --   --   --  0.10* 0.46  --  0.66  CREATININE 0.71  --   --   --   --   --   --   --  1.03*  --   TROPONINIHS  --  1,341*   < > 1,702* 2,892* 4,037*  --   --   --   --    < > = values in this interval not displayed.    Estimated Creatinine Clearance: 76.1 mL/min (A) (by C-G formula based on SCr of 1.03 mg/dL (H)).   Medical History: Past Medical History:  Diagnosis Date  . Cardiomyopathy ([place])    a. 11/2020 Echo: EF 20-25%, no rwma, glob HK, nl RV size/fxn.  Marland Kitchen COVID-19 virus infection 11/2020  . Migraines      Assessment: 50 year old female admitted with acute hypoxic respiratory failure s/t Covid and pulmonary edema. Patient failed BiPAP, requiring intubation. Pharmacy consult for heparin drip for ACS/STEMI.  1/14 1300 INITIAL =Heparin 4000 unit bolus followed by drip at 1050 units/hr.  1/14 1754 HL = < 0.10 -- per nurse has been on hold for line placement 1/15 0106 HL 0.46, therapeutic x 1 01/15 0510  HL=0.66, Heparin therapeutic,cont current rate  Goal of  Therapy:  Heparin level 0.3-0.7 units/ml Monitor platelets by anticoagulation protocol: Yes   Plan:  01/15 0510  HL=0.66, Heparin therapeutic. Continue Heparin at current rate.  Will f/u HL with am labs.  CBC daily while on heparin drip.  Noralee Space, PharmD Clinical Pharmacist  11/30/2020,8:54 AM

## 2020-11-30 NOTE — Progress Notes (Signed)
[facility] for heparin Indication: chest pain/ACS  Allergies  Allergen Reactions  . Penicillins Rash    Patient Measurements: Height: 5\' 8"  (172.7 cm) Weight: 88.4 kg (194 lb 14.2 oz) IBW/kg (Calculated) : 63.9 Heparin Dosing Weight: 82.4 kg  Vital Signs: Temp: 100.04 F (37.8 C) (01/15 0000) Temp Source: Bladder (01/14 1600) BP: 112/56 (01/15 0000) Pulse Rate: 84 (01/15 0000)  Labs: Recent Labs    11/29/20 0533 11/29/20 0600 11/29/20 0600 11/29/20 0926 11/29/20 1135 11/29/20 1318 11/29/20 1724 11/30/20 0106  HGB  --  16.1*  --   --   --   --   --   --   HCT  --  47.7*  --   --   --   --   --   --   PLT  --  438*  --   --   --   --   --   --   APTT  --   --   --  30  --   --   --   --   LABPROT  --   --   --  12.7  --   --   --   --   INR  --   --   --  1.0  --   --   --   --   HEPARINUNFRC  --   --   --   --   --   --  0.10* 0.46  CREATININE 0.71  --   --   --   --   --   --   --   TROPONINIHS  --  1,341*   < > 1,702* 2,892* 4,037*  --   --    < > = values in this interval not displayed.    Estimated Creatinine Clearance: 96.8 mL/min (by C-G formula based on SCr of 0.71 mg/dL).   Medical History: Past Medical History:  Diagnosis Date  . Cardiomyopathy ([place])    a. 11/2020 Echo: EF 20-25%, no rwma, glob HK, nl RV size/fxn.  Marland Kitchen COVID-19 virus infection 11/2020  . Migraines      Assessment: 50 year old female admitted with acute hypoxic respiratory failure s/t Covid and pulmonary edema. Patient failed BiPAP, requiring intubation. Pharmacy consult for heparin drip for ACS/STEMI.  1/14 1300 INITIAL =Heparin 4000 unit bolus followed by drip at 1050 units/hr.  1/14 1754 HL = < 0.10 -- per nurse has been on hold for line placement 1/15 0106 HL 0.46, therapeutic x 1  Goal of Therapy:  Heparin level 0.3-0.7 units/ml Monitor platelets by anticoagulation protocol: Yes   Plan:  Heparin therapeutic. Continue Heparin at current  rate.  Will re-check heparin level in 6 hours to confirm.  CBC daily while on heparin drip.  Ena Dawley, PharmD Clinical Pharmacist  11/30/2020,2:03 AM

## 2020-11-30 NOTE — Progress Notes (Signed)
Krystal Gregory, MRN:  [id number], DOB:  09/29/1971, LOS: 1 ADMISSION DATE:  11/29/2020, CONSULTATION DATE:  11/29/2020 REFERRING MD:  Dr. Charna Archer, CHIEF COMPLAINT:  Shortness of breath  Brief History:  50 y.o. Female with PMH of migraines admitted 11/29/20 with Acute Hypoxic Respiratory Failure in the setting of COVID-19 Pneumonia and Pulmonary Edema.  Failed trial of BiPAP requiring intubation in ED.  History of Present Illness:  Krystal Gregory is a 50 year old female with a past medical history notable for migraines who presents to [facility] ED on 11/29/2020 due to complaints of shortness of breath, cough nausea, vomiting, diarrhea, and generalized malaise.  Patient is currently intubated and sedated and no family present, therefore history is obtained from ED and nursing notes.  Per notes her symptoms began approximately 24 hours ago and rapidly progressed, and she was feeling as if she was about to pass out and was extremely weak.  She denied fever chills, chest pain, abdominal pain, edema.  ED course: Presentation to the ED her vitals included temperature 99.6 F rectally, respiratory rate 22, pulse 97, blood pressure 159/102, and 89% O2 saturations on room air.  Work-up is notable for glucose 355, lactic acid 5.4, WBC 15.5 with neutrophilia.  Her COVID-19 PCR is positive.  Influenza AMB PCR is negative.  Urinalysis is negative for urinary tract infection.  Chest x-ray with extensive airspace opacities to the right lung and ill-defined infiltrate to the left base.  She was to be admitted by the hospitalist for further work-up and treatment of acute hypoxic respiratory failure in the setting of COVID-19 pneumonia.  However while she was in the ED she developed acute respiratory distress along with increasing agitation.  Trial of BiPAP and Ativan attempted, however she stated she felt like she was drowning and was having gurgling respirations.  Concern was for flash pulmonary edema.  Given her  respiratory distress and worsening mentation decision was made to emergently intubate.  She was noted to have copious white frothy secretions with intubation.  Further work-up reveals trending BNP 755.9, and high sensitivity troponin 1341 which has trended upward to 2892, procalcitonin less than 0.10, fibrin derivative products 2670.  PCCM is consulted to admit the patient for further work-up and treatment of acute hypoxic respiratory failure in the setting of COVID-19 pneumonia and pulmonary edema, along with elevated troponin concerning for demand ischemia versus NSTEMI.  Cardiology has been consulted and she has been placed on heparin drip.  Given her rapid decline and elevated FDPs, will obtain CTA chest to rule out pulmonary embolism.  Past Medical History:  Migraines  Significant Hospital Events:  1/14: Presented to ED; rapidly deteriorated while in ED requiring emergent intubation  Consults:  PCCM Cardiology  Procedures:  1/14: Endotracheal intubation  Significant Diagnostic Tests:  1/14: CXR>>Dense consolidative and streaky opacities throughout the right lung with more mild opacities in the left lung base as well, can reflect a multifocal infection/pneumonia in the appropriate clinical Setting. 1/14: Echocardiogram>> 1/14: CTA Chest>>  Micro Data:  1/14: SARS-CoV-2 PCR>> Positive 1/14: Influenza A&B PCR>>negative 1/14: Blood culture x2>> 1/14: Urine>> 1/14: Tracheal aspirate>> 1/14: Strep pneumo urinary antigen>> 1/14: Legionella Urinary antigen>>  Antimicrobials:  Remdesivir 1/14>> Azithromycin 1/14>> Ceftriaxone 1/14>>  Interim History / Subjective:  Rapidly deteriorated earlier this morning (acute respiratory distress and AMS) requiring emergent intubation in ED, concern for flash pulmonary edema Currently intubated and sedated on Propofol Hemodynamically stable   Objective   Blood pressure 109/64, pulse 70, temperature 98.96  F (37.2 C), resp. rate 14,  height 5\' 8"  (1.727 m), weight 90.7 kg, SpO2 92 %.    Vent Mode: PRVC;PSV FiO2 (%):  [28 %-100 %] 28 % Set Rate:  [22 bmp] 22 bmp Vt Set:  [450 mL] 450 mL PEEP:  [5 cmH20-10 cmH20] 5 cmH20 Pressure Support:  [5 cmH20] 5 cmH20 Plateau Pressure:  [0.18 cmH20-17 cmH20] 0.18 cmH20   Intake/Output Summary (Last 24 hours) at 11/30/2020 1124 Last data filed at 11/30/2020 1108 Gross per 24 hour  Intake 2259.27 ml  Output 1025 ml  Net 1234.27 ml   Filed Weights   11/29/20 0525 11/30/20 0500  Weight: 88.4 kg 90.7 kg    Examination: General: Critically ill appearing female, laying in bed, intubated and sedated, in NAD HENT: Atraumatic, normocephalic, neck supple, no JVD Lungs: decreased air entry at bases b/l with MV sounds in background Cardiovascular: Tachycardia, regular rhythm, 2+ distal pulses Abdomen: Obese, soft, nontender, nondistended, no guarding or rebound tenderness Extremities: No deformties, 1+ bilateral LE edema Neuro: Sedated, withdraws from pain, pupils PERRL Skin: Warm and dry.  No obvious rashes, lesions, or ulcerations  Resolved Hospital Problem list   N/A  Assessment & Plan:   Acute Hypoxic Respiratory Failure in the setting of COVID-19 Pneumonia & Pulmonary Edema -Mechanical ventilation via ARDS protocol, target PRVC 6 cc/kg -Wean PEEP and FiO2 as able to maintain O2 sats >88% -Goal plateau pressure less than 30, driving pressure less than 15 -Paralytics if necessary for vent synchrony, gas exchange -Cycle prone positioning if necessary for oxygenation -Deep sedation per PAD protocol, goal RASS -4, currently fentanyl, midazolam -Diuresis as blood pressure and renal function can tolerate, goal CVP 5-8.   -VAP prevention order set -Remdesivir, plan for 5 days -Steroids (Solumedrol 60 mg BID) -Follow inflammatory markers: Ferritin, D-dimer, CRP, IL-6, LDH -Assess for possible tocilizumab  -Vitamin C, zinc -Plan to repeat and check resp cultures   Elevated  Troponin, demand ischemia vs. NSTEMI Pulmonary Edema (no known history of CHF) Lactic acidosis Hypertension -Continuous cardiac monitoring -Maintain MAP >65 -Received 40 mg IV Lasix in ED -Further diuresis as BP and renal function permits -Trend troponin -Trend lactic acid -Place on Heparin gtt -Cardiology consulted, appreciate input -Echocardiogram pending -Elevated FDP's (2670), discussed with    COVID-19 Pneumonia -Monitor fever curve -Trend WBC's & Procalcitonin -Follow cultures as above -Remdesivir, plan for 5 day course -No Baricitinib for now due to ?  bacterial PNA -Continue Azithromycin & Rocephin for now pending cultures & sensitivities -Maintain Airborne & Contact Precautions   Hyperglycemia -CBG's -SSI -Follow ICU Hypo/hyperglycemia protocol -Check Hgb A1c>> 6.1   Best practice (evaluated daily)  Diet: NPO Pain/Anxiety/Delirium protocol (if indicated): Fentanyl and propofol VAP protocol (if indicated): yes DVT prophylaxis: Heparin gtt GI prophylaxis: Pepcid Glucose control: SSI Mobility: Bedrest Disposition:ICU  Goals of Care:  Last date of multidisciplinary goals of care discussion:N/A Family and staff present: Updated pt's father Sundeep Holderfield via Telephone 11/30/19 Summary of discussion: Critical illness w/ covid requiring intubation, ? Superimposed bacterial PNA and PE Follow up goals of care discussion due: 11/30/20 Code Status: Full code  Labs   CBC: Recent Labs  Lab 11/29/20 0600 11/30/20 0510  WBC 15.5* 25.9*  NEUTROABS 14.1* 24.3*  HGB 16.1* 15.1*  HCT 47.7* 44.3  MCV 88.5 88.4  PLT 438* 441*    Basic Metabolic Panel: Recent Labs  Lab 11/29/20 0533 11/30/20 0220  NA 138 139  K 4.0 4.0  CL 104 104  CO2  20* 24  GLUCOSE 355* 342*  BUN 13 24*  CREATININE 0.71 1.03*  CALCIUM 8.8* 8.5*  MG  --  1.8  PHOS  --  1.8*   GFR: Estimated Creatinine Clearance: 76.1 mL/min (A) (by C-G formula based on SCr of 1.03 mg/dL (H)). Recent  Labs  Lab 11/29/20 0600 11/29/20 0730 11/29/20 0926 11/29/20 0942 11/29/20 1318 11/30/20 0220 11/30/20 0510  PROCALCITON  --   --  <0.10  --   --  1.19  --   WBC 15.5*  --   --   --   --   --  25.9*  LATICACIDVEN 5.4* 4.0*  --  4.8* 4.5*  --   --     Liver Function Tests: Recent Labs  Lab 11/29/20 0533 11/30/20 0220  AST 40 33  ALT 33 25  ALKPHOS 78 66  BILITOT 0.7 0.6  PROT 7.3 6.0*  ALBUMIN 3.8 2.9*   Recent Labs  Lab 11/29/20 0533  LIPASE 23   No results for input(s): AMMONIA in the last 168 hours.  ABG    Component Value Date/Time   PHART 7.42 11/29/2020 1600   PCO2ART 35 11/29/2020 1600   PO2ART 110 (H) 11/29/2020 1600   HCO3 22.7 11/29/2020 1600   ACIDBASEDEF 1.1 11/29/2020 1600   O2SAT 98.4 11/29/2020 1600     Coagulation Profile: Recent Labs  Lab 11/29/20 0926  INR 1.0    Cardiac Enzymes: No results for input(s): CKTOTAL, CKMB, CKMBINDEX, TROPONINI in the last 168 hours.  HbA1C: Hgb A1c MFr Bld  Date/Time Value Ref Range Status  11/29/2020 07:15 AM 6.1 (H) 4.8 - 5.6 % Final    Comment:    (NOTE) Pre diabetes:          5.7%-6.4%  Diabetes:              >6.4%  Glycemic control for   <7.0% adults with diabetes     CBG: Recent Labs  Lab 11/29/20 1946 11/29/20 2318 11/30/20 0318 11/30/20 0736 11/30/20 1058  GLUCAP 280* 318* 336* 179* 165*    Review of Systems:   Unable to assess due to intubation, sedation, and critical illness  Past Medical History:  She,  has a past medical history of Cardiomyopathy ([place]), COVID-19 virus infection (11/2020), and Migraines.   Surgical History:  History reviewed. No pertinent surgical history.   Social History:   reports that she quit smoking about 21 years ago. Her smoking use included cigarettes. She has never used smokeless tobacco. She reports current alcohol use. She reports current drug use. Drug: Marijuana.   Family History:  Her family history is not on file.    Allergies Allergies  Allergen Reactions  . Penicillins Rash     Home Medications  Prior to Admission medications   Medication Sig Start Date End Date Taking? Authorizing Provider  diphenhydrAMINE (BENADRYL) 25 mg capsule Take 100 mg by mouth every 6 (six) hours as needed. Sleep and congestion    [provider]  ibuprofen (ADVIL,MOTRIN) 800 MG tablet Take 1 tablet (800 mg total) by mouth 3 (three) times daily. 11/29/12   Kirichenko, Lahoma Rocker, PA-C  SUMAtriptan (IMITREX) 100 MG tablet Take 100 mg by mouth every 2 (two) hours as needed. Migraines    [provider]     Critical care time: 32 minutes      Critical care provider statement:    Critical care time (minutes):  32   Critical care time was exclusive of:  Separately  billable procedures and  treating other patients   Critical care was necessary to treat or prevent imminent or  life-threatening deterioration of the following conditions:  acute hypoxemic respiratory failure, COVID19   Critical care was time spent personally by me on the following  activities:  Development of treatment plan with patient or surrogate,  discussions with consultants, evaluation of patient's response to  treatment, examination of patient, obtaining history from patient or  surrogate, ordering and performing treatments and interventions, ordering  and review of laboratory studies and re-evaluation of patient's condition   I assumed direction of critical care for this patient from another  provider in my specialty: no       Ottie Glazier, M.D.  [facility] [place]

## 2020-11-30 NOTE — Plan of Care (Signed)
Patient extubated today and doing well. Pressors weaned this am. Pt awakened, passed SBT and extubated. Now on 2L [place]. VSS. Clear liquid diet, advance as tolerated.   Problem: Safety: Goal: Non-violent Restraint(s) Outcome: Progressing   Problem: Education: Goal: Knowledge of General Education information will improve Description: Including pain rating scale, medication(s)/side effects and non-pharmacologic comfort measures Outcome: Progressing   Problem: Health Behavior/Discharge Planning: Goal: Ability to manage health-related needs will improve Outcome: Progressing   Problem: Clinical Measurements: Goal: Ability to maintain clinical measurements within normal limits will improve Outcome: Progressing Goal: Will remain free from infection Outcome: Progressing Goal: Diagnostic test results will improve Outcome: Progressing Goal: Respiratory complications will improve Outcome: Progressing Goal: Cardiovascular complication will be avoided Outcome: Progressing   Problem: Activity: Goal: Risk for activity intolerance will decrease Outcome: Progressing   Problem: Nutrition: Goal: Adequate nutrition will be maintained Outcome: Progressing   Problem: Coping: Goal: Level of anxiety will decrease Outcome: Progressing   Problem: Elimination: Goal: Will not experience complications related to bowel motility Outcome: Progressing Goal: Will not experience complications related to urinary retention Outcome: Progressing   Problem: Pain Managment: Goal: General experience of comfort will improve Outcome: Progressing   Problem: Safety: Goal: Ability to remain free from injury will improve Outcome: Progressing   Problem: Skin Integrity: Goal: Risk for impaired skin integrity will decrease Outcome: Progressing

## 2020-12-01 ENCOUNTER — Encounter: Payer: Self-pay | Admitting: Pulmonary Disease

## 2020-12-01 LAB — CBC WITH DIFFERENTIAL/PLATELET
Abs Immature Granulocytes: 0.24 10*3/uL — ABNORMAL HIGH (ref 0.00–0.07)
Basophils Absolute: 0 10*3/uL (ref 0.0–0.1)
Basophils Relative: 0 %
Eosinophils Absolute: 0 10*3/uL (ref 0.0–0.5)
Eosinophils Relative: 0 %
HCT: 39.7 % (ref 36.0–46.0)
Hemoglobin: 13.7 g/dL (ref 12.0–15.0)
Immature Granulocytes: 1 %
Lymphocytes Relative: 6 %
Lymphs Abs: 1.2 10*3/uL (ref 0.7–4.0)
MCH: 30.8 pg (ref 26.0–34.0)
MCHC: 34.5 g/dL (ref 30.0–36.0)
MCV: 89.2 fL (ref 80.0–100.0)
Monocytes Absolute: 0.9 10*3/uL (ref 0.1–1.0)
Monocytes Relative: 4 %
Neutro Abs: 19.5 10*3/uL — ABNORMAL HIGH (ref 1.7–7.7)
Neutrophils Relative %: 89 %
Platelets: 293 10*3/uL (ref 150–400)
RBC: 4.45 MIL/uL (ref 3.87–5.11)
RDW: 13.8 % (ref 11.5–15.5)
WBC: 21.8 10*3/uL — ABNORMAL HIGH (ref 4.0–10.5)
nRBC: 0 % (ref 0.0–0.2)

## 2020-12-01 LAB — COMPREHENSIVE METABOLIC PANEL
ALT: 25 U/L (ref 0–44)
AST: 27 U/L (ref 15–41)
Albumin: 2.8 g/dL — ABNORMAL LOW (ref 3.5–5.0)
Alkaline Phosphatase: 50 U/L (ref 38–126)
Anion gap: 10 (ref 5–15)
BUN: 21 mg/dL — ABNORMAL HIGH (ref 6–20)
CO2: 27 mmol/L (ref 22–32)
Calcium: 8.2 mg/dL — ABNORMAL LOW (ref 8.9–10.3)
Chloride: 101 mmol/L (ref 98–111)
Creatinine, Ser: 0.6 mg/dL (ref 0.44–1.00)
GFR, Estimated: >60 mL/min (ref >60)
Glucose, Bld: 191 mg/dL — ABNORMAL HIGH (ref 70–99)
Potassium: 3.7 mmol/L (ref 3.5–5.1)
Sodium: 138 mmol/L (ref 135–145)
Total Bilirubin: 0.6 mg/dL (ref 0.3–1.2)
Total Protein: 5.7 g/dL — ABNORMAL LOW (ref 6.5–8.1)

## 2020-12-01 LAB — GLUCOSE, CAPILLARY
Glucose-Capillary: 143 mg/dL — ABNORMAL HIGH (ref 70–99)
Glucose-Capillary: 160 mg/dL — ABNORMAL HIGH (ref 70–99)
Glucose-Capillary: 166 mg/dL — ABNORMAL HIGH (ref 70–99)
Glucose-Capillary: 169 mg/dL — ABNORMAL HIGH (ref 70–99)
Glucose-Capillary: 191 mg/dL — ABNORMAL HIGH (ref 70–99)
Glucose-Capillary: 200 mg/dL — ABNORMAL HIGH (ref 70–99)

## 2020-12-01 LAB — LIPID PANEL
Cholesterol: 159 mg/dL (ref 0–200)
HDL: 73 mg/dL (ref >40)
LDL Cholesterol: 68 mg/dL (ref 0–99)
Total CHOL/HDL Ratio: 2.2 RATIO
Triglycerides: 88 mg/dL (ref <150)
VLDL: 18 mg/dL (ref 0–40)

## 2020-12-01 LAB — C-REACTIVE PROTEIN: CRP: 4.5 mg/dL — ABNORMAL HIGH (ref <1.0)

## 2020-12-01 LAB — FIBRIN DERIVATIVES D-DIMER (ARMC ONLY): Fibrin derivatives D-dimer (ARMC): 641.03 ng/mL (FEU) — ABNORMAL HIGH (ref 0.00–499.00)

## 2020-12-01 LAB — PROCALCITONIN: Procalcitonin: 0.73 ng/mL

## 2020-12-01 LAB — MAGNESIUM: Magnesium: 2 mg/dL (ref 1.7–2.4)

## 2020-12-01 LAB — PHOSPHORUS: Phosphorus: 3.8 mg/dL (ref 2.5–4.6)

## 2020-12-01 LAB — HEPARIN LEVEL (UNFRACTIONATED): Heparin Unfractionated: 0.66 IU/mL (ref 0.30–0.70)

## 2020-12-01 LAB — FERRITIN: Ferritin: 103 ng/mL (ref 11–307)

## 2020-12-01 MED ORDER — FUROSEMIDE 10 MG/ML IJ SOLN
20.0000 mg | Freq: Two times a day (BID) | INTRAMUSCULAR | Status: AC
  Administered 2020-12-01 – 2020-12-02 (×3): 20 mg via INTRAVENOUS
  Filled 2020-12-01 (×3): qty 2

## 2020-12-01 MED ORDER — ASPIRIN EC 81 MG PO TBEC
81.0000 mg | DELAYED_RELEASE_TABLET | Freq: Every day | ORAL | Status: DC
  Administered 2020-12-01 – 2020-12-03 (×3): 81 mg via ORAL
  Filled 2020-12-01 (×3): qty 1

## 2020-12-01 MED ORDER — DIPHENHYDRAMINE HCL 25 MG PO CAPS
25.0000 mg | ORAL_CAPSULE | Freq: Every evening | ORAL | Status: DC | PRN
  Administered 2020-12-01: 23:00:00 25 mg via ORAL
  Filled 2020-12-01 (×2): qty 1

## 2020-12-01 MED ORDER — LISINOPRIL 5 MG PO TABS
5.0000 mg | ORAL_TABLET | Freq: Every day | ORAL | Status: DC
  Administered 2020-12-01 – 2020-12-02 (×2): 5 mg via ORAL
  Filled 2020-12-01 (×3): qty 1

## 2020-12-01 NOTE — Progress Notes (Addendum)
Progress Note  Patient Name: Krystal Gregory Date of Encounter: 12/01/2020  [facility] Cardiologist: Kathlyn Sacramento, MD   Subjective   Patient extubated yesterday, feels well.  Denies chest pain or shortness of breath.  Denies any history of heart disease.  Inpatient Medications    Scheduled Meds: . vitamin C  500 mg Per Tube Daily  . aspirin  324 mg Oral Daily   Or  . aspirin  300 mg Rectal Daily  . atorvastatin  40 mg Oral Daily  . chlorhexidine  15 mL Mouth Rinse BID  . Chlorhexidine Gluconate Cloth  6 each Topical Daily  . docusate  100 mg Per Tube BID  . guaiFENesin  5 mL Oral Once  . insulin aspart  0-20 Units Subcutaneous Q4H  . mouth rinse  15 mL Mouth Rinse q12n4p  . methylPREDNISolone (SOLU-MEDROL) injection  40 mg Intravenous Q12H  . polyethylene glycol  17 g Per Tube Daily  . zinc sulfate  220 mg Per Tube Daily   Continuous Infusions: . sodium chloride 250 mL (11/30/20 0050)  . azithromycin 500 mg (12/01/20 0602)  . cefTRIAXone (ROCEPHIN)  IV 2 g (12/01/20 0601)  . famotidine (PEPCID) IV Stopped (12/01/20 0033)  . heparin 1,050 Units/hr (12/01/20 0400)  . norepinephrine Stopped (11/30/20 1021)  . remdesivir 100 mg in NS 100 mL Stopped (11/30/20 1028)  . sodium chloride     PRN Meds: acetaminophen, albuterol, dextromethorphan-guaiFENesin, docusate, hydrALAZINE, loperamide, midazolam, midazolam, ondansetron (ZOFRAN) IV, polyethylene glycol, promethazine   Vital Signs    Vitals:   12/01/20 0300 12/01/20 0400 12/01/20 0500 12/01/20 0600  BP:  131/83  135/71  Pulse: 71 72 69 (!) 59  Resp: (!) 26 (!) 21 20 (!) 23  Temp: 98.6 F (37 C) 98.42 F (36.9 C) 98.42 F (36.9 C) 98.42 F (36.9 C)  TempSrc:      SpO2: 97% 98% 96% 96%  Weight:   90.5 kg   Height:        Intake/Output Summary (Last 24 hours) at 12/01/2020 0846 Last data filed at 12/01/2020 0500 Gross per 24 hour  Intake 1252.14 ml  Output 1995 ml  Net -742.86 ml   Last 3 Weights  12/01/2020 11/30/2020 11/29/2020  Weight (lbs) 199 lb 8.3 oz 199 lb 15.3 oz 194 lb 14.2 oz  Weight (kg) 90.5 kg 90.7 kg 88.4 kg      Telemetry    Sinus rhythm- Personally Reviewed  ECG    No new tracing- Personally Reviewed  Physical Exam   GEN: No acute distress.   Neck: No JVD Cardiac: RRR, no murmurs, rubs, or gallops.  Respiratory: Clear to auscultation anteriorly, decreased breath sounds at bases. GI: Soft, nontender, non-distended  MS: No edema; No deformity. Neuro:  Nonfocal  Psych: Normal affect   Labs    High Sensitivity Troponin:   Recent Labs  Lab 11/29/20 0600 11/29/20 0926 11/29/20 1135 11/29/20 1318  TROPONINIHS 1,341* 1,702* 2,892* 4,037*      Chemistry Recent Labs  Lab 11/29/20 0533 11/30/20 0220 12/01/20 0520  NA 138 139 138  K 4.0 4.0 3.7  CL 104 104 101  CO2 20* 24 27  GLUCOSE 355* 342* 191*  BUN 13 24* 21*  CREATININE 0.71 1.03* 0.60  CALCIUM 8.8* 8.5* 8.2*  PROT 7.3 6.0* 5.7*  ALBUMIN 3.8 2.9* 2.8*  AST 40 33 27  ALT 33 25 25  ALKPHOS 78 66 50  BILITOT 0.7 0.6 0.6  GFRNONAA >60 >60 >60  ANIONGAP 14 11 10      Hematology Recent Labs  Lab 11/29/20 0600 11/30/20 0510 12/01/20 0520  WBC 15.5* 25.9* 21.8*  RBC 5.39* 5.01 4.45  HGB 16.1* 15.1* 13.7  HCT 47.7* 44.3 39.7  MCV 88.5 88.4 89.2  MCH 29.9 30.1 30.8  MCHC 33.8 34.1 34.5  RDW 13.2 13.8 13.8  PLT 438* 441* 293    BNP Recent Labs  Lab 11/29/20 0600  BNP 755.9*     DDimer No results for input(s): DDIMER in the last 168 hours.   Radiology    DG Abdomen 1 View  Result Date: 11/29/2020 CLINICAL DATA:  Orogastric tube placement EXAM: ABDOMEN - 1 VIEW COMPARISON:  None. FINDINGS: Orogastric tube tip and side port are in the stomach. There is no bowel dilatation or air-fluid level to suggest bowel obstruction. No free air. Intrauterine device in mid pelvis. IMPRESSION: Orogastric tube tip and side port in stomach. No evident bowel obstruction or free air. Intrauterine  device in mid pelvis. Electronically Signed   By: Lowella Grip III M.D.   On: 11/29/2020 08:57   CT ANGIO CHEST PE W OR WO CONTRAST  Result Date: 11/29/2020 CLINICAL DATA:  Positive D-dimer. Shortness of breath. Coronavirus infection. EXAM: CT ANGIOGRAPHY CHEST WITH CONTRAST TECHNIQUE: Multidetector CT imaging of the chest was performed using the standard protocol during bolus administration of intravenous contrast. Multiplanar CT image reconstructions and MIPs were obtained to evaluate the vascular anatomy. CONTRAST:  27mL OMNIPAQUE IOHEXOL 350 MG/ML SOLN COMPARISON:  Chest radiography earlier same day FINDINGS: Cardiovascular: Heart size is normal. No pericardial effusion. Pulmonary arterial opacification is excellent. No pulmonary emboli. No aortic atherosclerotic calcification or coronary artery calcification. Mediastinum/Nodes: No mediastinal or hilar mass or lymphadenopathy. Lungs/Pleura: Small bilateral pleural effusions. Areas of patchy pulmonary consolidation including dependent consolidation in both lungs. Pattern is somewhat atypical for coronavirus infection alone and there could be coronavirus infection with superimposed bacterial pneumonia. No pneumothorax. Upper Abdomen: Multiple gallstones dependent in the gallbladder. Orogastric tube enters the stomach. Musculoskeletal: Normal Review of the MIP images confirms the above findings. IMPRESSION: 1. No pulmonary emboli. 2. Small bilateral pleural effusions. Areas of patchy pulmonary consolidation including dependent consolidation in both lungs. Pattern is somewhat atypical for coronavirus infection alone and there could be superimposed bacterial pneumonia. 3. Chololithiasis. Electronically Signed   By: Nelson Chimes M.D.   On: 11/29/2020 14:29   DG Chest Port 1 View  Result Date: 11/30/2020 CLINICAL DATA:  Respiratory failure EXAM: PORTABLE CHEST 1 VIEW COMPARISON:  November 29, 2020 FINDINGS: There are persistent hazy bilateral airspace  opacities. The lines and tubes are essentially stable in appearance. There is no pneumothorax. There are small bilateral pleural effusions. The heart size is stable. IMPRESSION: 1. Lines and tubes as above. 2. Persistent hazy bilateral airspace opacities and small bilateral pleural effusions. Overall exam is not significantly changed from 1 day prior. Electronically Signed   By: Constance Holster M.D.   On: 11/30/2020 05:13   DG Chest Port 1 View  Result Date: 11/29/2020 CLINICAL DATA:  Encounter for central line placement EXAM: PORTABLE CHEST 1 VIEW COMPARISON:  Chest x-ray 11/29/2020 8:56 a.m., CT chest 11/29/2020 FINDINGS: Interval placement of a left internal jugular central venous catheter with tip overlying the expected region of the superior cavoatrial junction. Endotracheal tube in grossly similar position with tip terminating 5.5 cm above the carina. Enteric tube noted coursing below diaphragm with tip and side port collimated off view. The heart size and mediastinal  contours are unchanged. Persistent diffuse central patchy airspace opacities that are worse on the right compared to the left. No pleural effusion. No pneumothorax. No acute osseous abnormality. IMPRESSION: 1. Interval placement of a left internal jugular central venous catheter with tip overlying the expected region of the superior cavoatrial junction. Other lines and tubes are in stable position. 2. Persistent diffuse central patchy airspace opacities, right greater than left. Electronically Signed   By: Iven Finn M.D.   On: 11/29/2020 18:18   DG Chest Portable 1 View  Result Date: 11/29/2020 CLINICAL DATA:  Hypoxia EXAM: PORTABLE CHEST 1 VIEW COMPARISON:  November 29, 2020 study obtained earlier in the day FINDINGS: Endotracheal tube tip is 4.6 cm above the carina. Nasogastric tube tip and side port are below the diaphragm. No pneumothorax. There is airspace consolidation throughout much of the right lung, increased from  earlier in the day. There is airspace opacity in the medial left base. There is mild cardiomegaly with pulmonary venous hypertension. No adenopathy evident. IMPRESSION: Tube positions as described without pneumothorax. Extensive airspace opacity throughout much of the right lung, slightly increased. Ill-defined infiltrate left base. There is a degree of cardiomegaly with pulmonary vascular congestion. Electronically Signed   By: Lowella Grip III M.D.   On: 11/29/2020 08:56   ECHOCARDIOGRAM COMPLETE  Result Date: 11/29/2020    ECHOCARDIOGRAM REPORT   Patient Name:   JACALYN SOULSBY Date of Exam: 11/29/2020 Medical Rec #:  [id number]       Height:       68.0 in Accession #:    [id number]      Weight:       194.9 lb Date of Birth:  05/31/1971       BSA:          2.021 m Patient Age:    50 years        BP:           101/81 mmHg Patient Gender: F               HR:           136 bpm. Exam Location:  [facility] Procedure: 2D Echo, Cardiac Doppler and Color Doppler Indications:     Elevated Troponin  History:         Patient has no prior history of Echocardiogram examinations.                  Migraines.  Sonographer:     Sherrie Sport RDCS (AE) Referring Phys:  Kathlyn Sacramento MD Diagnosing Phys: Kathlyn Sacramento MD  Sonographer Comments: Echo performed with patient supine and on artificial respirator. IMPRESSIONS  1. Left ventricular ejection fraction, by estimation, is 20 to 25%. The left ventricle has severely decreased function. The left ventricle demonstrates global hypokinesis. Left ventricular diastolic parameters are indeterminate.  2. Right ventricular systolic function is normal. The right ventricular size is normal. Tricuspid regurgitation signal is inadequate for assessing PA pressure.  3. The mitral valve is normal in structure. No evidence of mitral valve regurgitation. No evidence of mitral stenosis.  4. The aortic valve is normal in structure. Aortic valve regurgitation is not visualized. No aortic stenosis is  present.  5. The inferior vena cava is dilated in size with <50% respiratory variability, suggesting right atrial pressure of 15 mmHg. FINDINGS  Left Ventricle: Left ventricular ejection fraction, by estimation, is 20 to 25%. The left ventricle has severely decreased function. The left ventricle demonstrates global hypokinesis. The  left ventricular internal cavity size was normal in size. There is no left ventricular hypertrophy. Left ventricular diastolic parameters are indeterminate. Right Ventricle: The right ventricular size is normal. No increase in right ventricular wall thickness. Right ventricular systolic function is normal. Tricuspid regurgitation signal is inadequate for assessing PA pressure. Left Atrium: Left atrial size was normal in size. Right Atrium: Right atrial size was normal in size. Pericardium: There is no evidence of pericardial effusion. Mitral Valve: The mitral valve is normal in structure. No evidence of mitral valve regurgitation. No evidence of mitral valve stenosis. Tricuspid Valve: The tricuspid valve is normal in structure. Tricuspid valve regurgitation is not demonstrated. No evidence of tricuspid stenosis. Aortic Valve: The aortic valve is normal in structure. Aortic valve regurgitation is not visualized. No aortic stenosis is present. Aortic valve mean gradient measures 3.0 mmHg. Aortic valve peak gradient measures 4.8 mmHg. Aortic valve area, by VTI measures 1.78 cm. Pulmonic Valve: The pulmonic valve was normal in structure. Pulmonic valve regurgitation is not visualized. No evidence of pulmonic stenosis. Aorta: The aortic root is normal in size and structure. Venous: The inferior vena cava is dilated in size with less than 50% respiratory variability, suggesting right atrial pressure of 15 mmHg. IAS/Shunts: No atrial level shunt detected by color flow Doppler.  LEFT VENTRICLE PLAX 2D LVIDd:         4.90 cm      Diastology LVIDs:         4.60 cm      LV e' medial:    4.24 cm/s  LV PW:         1.20 cm      LV E/e' medial:  12.7 LV IVS:        1.00 cm      LV e' lateral:   4.35 cm/s LVOT diam:     2.10 cm      LV E/e' lateral: 12.4 LV SV:         22 LV SV Index:   11 LVOT Area:     3.46 cm  LV Volumes (MOD) LV vol d, MOD A2C: 104.0 ml LV vol d, MOD A4C: 116.0 ml LV vol s, MOD A2C: 76.0 ml LV vol s, MOD A4C: 85.7 ml LV SV MOD A2C:     28.0 ml LV SV MOD A4C:     116.0 ml LV SV MOD BP:      30.1 ml RIGHT VENTRICLE RV S prime:     10.40 cm/s TAPSE (M-mode): 3.0 cm LEFT ATRIUM             Index       RIGHT ATRIUM          Index LA diam:        3.50 cm 1.73 cm/m  RA Area:     9.98 cm LA Vol (A2C):   40.2 ml 19.89 ml/m RA Volume:   21.40 ml 10.59 ml/m LA Vol (A4C):   18.3 ml 9.05 ml/m LA Biplane Vol: 28.7 ml 14.20 ml/m  AORTIC VALVE                   PULMONIC VALVE AV Area (Vmax):    1.62 cm    PV Vmax:        0.82 m/s AV Area (Vmean):   1.32 cm    PV Peak grad:   2.7 mmHg AV Area (VTI):     1.78 cm    RVOT Peak grad: 6  mmHg AV Vmax:           110.00 cm/s AV Vmean:          83.000 cm/s AV VTI:            0.125 m AV Peak Grad:      4.8 mmHg AV Mean Grad:      3.0 mmHg LVOT Vmax:         51.60 cm/s LVOT Vmean:        31.600 cm/s LVOT VTI:          0.064 m LVOT/AV VTI ratio: 0.51  AORTA Ao Root diam: 3.20 cm MITRAL VALVE MV Area (PHT): 10.68 cm   SHUNTS MV Decel Time: 71 msec     Systemic VTI:  0.06 m MV E velocity: 54.00 cm/s  Systemic Diam: 2.10 cm MV A velocity: 80.10 cm/s MV E/A ratio:  0.67 Kathlyn Sacramento MD Electronically signed by Kathlyn Sacramento MD Signature Date/Time: 11/29/2020/3:20:02 PM    Final     Cardiac Studies   TTE 11/29/2020 1. Left ventricular ejection fraction, by estimation, is 20 to 25%. The  left ventricle has severely decreased function. The left ventricle  demonstrates global hypokinesis. Left ventricular diastolic parameters are  indeterminate.  2. Right ventricular systolic function is normal. The right ventricular  size is normal. Tricuspid  regurgitation signal is inadequate for assessing  PA pressure.  3. The mitral valve is normal in structure. No evidence of mitral valve  regurgitation. No evidence of mitral stenosis.  4. The aortic valve is normal in structure. Aortic valve regurgitation is  not visualized. No aortic stenosis is present.  5. The inferior vena cava is dilated in size with <50% respiratory  variability, suggesting right atrial pressure of 15 mmHg.   Patient Profile     50 y.o. female with history of migraines presenting to the hospital with shortness of breath, dizziness.  Diagnosed with respiratory failure in the setting of COVID-19 pneumonia.  Being seen for NSTEMI and severely reduced ejection fraction  Assessment & Plan    1. NSTEMI -Start aspirin 81 mg, continue Lipitor 40 mg daily. -Obtain lipid panel. -Continue heparin drip to complete 48-hour course (945am today) -Plan for right and left heart cath after pulmonary status improves.  2.  Cardiomyopathy, EF 20 to 25% -Chest x-ray with small bilateral effusions -Ischemic work-up after resolution of COVID-19 pneumonia -Heart rate low normal, blood pressure okay. -Challenge patient with low-dose ACE inhibitor, lisinopril 5 mg daily. -Avoiding beta-blockers for now due to low heart rates. -Start Lasix 20 mg IV twice daily.  3. COVID-19 pneumonia -Management as per critical care team  Total encounter time 35 minutes  Greater than 50% was spent in counseling and coordination of care with the patient.  Plan discussed in detail with patient and sister, Krystal Gregory over the phone.      Signed, Kate Sable, MD  12/01/2020, 8:46 AM

## 2020-12-01 NOTE — Clinical Note (Incomplete)
Went to give pt bedtime medication, pt was drowsy and had blank stare and staring off. Pt verbalized she was fine, after nurse asked was she ok. Pt HR in the 50s. After speaking with provider, verified no additional medications were at bedside. Went through personal belongings with patient and documented belongings at bedside with pt in chart. No additional medication in patient's possession

## 2020-12-01 NOTE — Progress Notes (Addendum)
[place] for heparin Indication: chest pain/ACS  Allergies  Allergen Reactions  . Penicillins Rash    Patient Measurements: Height: 5\' 8"  (172.7 cm) Weight: 90.5 kg (199 lb 8.3 oz) IBW/kg (Calculated) : 63.9 Heparin Dosing Weight: 82.4 kg  Vital Signs: Temp: 98.42 F (36.9 C) (01/16 0600) BP: 135/71 (01/16 0600) Pulse Rate: 59 (01/16 0600)  Labs: Recent Labs    11/29/20 0533 11/29/20 0600 11/29/20 0600 11/29/20 0926 11/29/20 1135 11/29/20 1318 11/29/20 1724 11/30/20 0106 11/30/20 0220 11/30/20 0510 12/01/20 0520  HGB  --  16.1*   < >  --   --   --   --   --   --  15.1* 13.7  HCT  --  47.7*  --   --   --   --   --   --   --  44.3 39.7  PLT  --  438*  --   --   --   --   --   --   --  441* 293  APTT  --   --   --  30  --   --   --   --   --   --   --   LABPROT  --   --   --  12.7  --   --   --   --   --   --   --   INR  --   --   --  1.0  --   --   --   --   --   --   --   HEPARINUNFRC  --   --   --   --   --   --    < > 0.46  --  0.66 0.66  CREATININE 0.71  --   --   --   --   --   --   --  1.03*  --  0.60  TROPONINIHS  --  1,341*   < > 1,702* 2,892* 4,037*  --   --   --   --   --    < > = values in this interval not displayed.    Estimated Creatinine Clearance: 97.8 mL/min (by C-G formula based on SCr of 0.6 mg/dL).   Medical History: Past Medical History:  Diagnosis Date  . Cardiomyopathy ([place])    a. 11/2020 Echo: EF 20-25%, no rwma, glob HK, nl RV size/fxn.  Marland Kitchen COVID-19 virus infection 11/2020  . Migraines      Assessment: 50 year old female admitted with acute hypoxic respiratory failure s/t Covid and pulmonary edema. Patient failed BiPAP, requiring intubation. Pharmacy consult for heparin drip for ACS/STEMI.  1/14 1300 INITIAL =Heparin 4000 unit bolus followed by drip at 1050 units/hr.  1/14 1754 HL = < 0.10 -- per nurse has been on hold for line placement 1/15 0106 HL 0.46, therapeutic x 1 01/15 0510  HL=0.66,  Heparin therapeutic,cont current rate   Goal of Therapy:  Heparin level 0.3-0.7 units/ml Monitor platelets by anticoagulation protocol: Yes   Plan:  01/16 0520  HL=0.66, Heparin therapeutic x2. Continue Heparin at current rate. Will f/u HL with am labs.   CBC daily while on heparin drip.  Noralee Space, PharmD Clinical Pharmacist  12/01/2020,8:52 AM

## 2020-12-01 NOTE — Progress Notes (Signed)
NAMEKAROLYNA Gregory, MRN:  [id number], DOB:  Oct 03, 1971, LOS: 2 ADMISSION DATE:  11/29/2020, CONSULTATION DATE:  11/29/2020 REFERRING MD:  Dr. Charna Archer, CHIEF COMPLAINT:  Shortness of breath  Brief History:  50 y.o. Female with PMH of migraines admitted 11/29/20 with Acute Hypoxic Respiratory Failure in the setting of COVID-19 Pneumonia and Pulmonary Edema.  Failed trial of BiPAP requiring intubation in ED.  History of Present Illness:  Krystal Gregory is a 50 year old female with a past medical history notable for migraines who presents to [facility] ED on 11/29/2020 due to complaints of shortness of breath, cough nausea, vomiting, diarrhea, and generalized malaise.  Patient is currently intubated and sedated and no family present, therefore history is obtained from ED and nursing notes.  Per notes her symptoms began approximately 24 hours ago and rapidly progressed, and she was feeling as if she was about to pass out and was extremely weak.  She denied fever chills, chest pain, abdominal pain, edema.  ED course: Presentation to the ED her vitals included temperature 99.6 F rectally, respiratory rate 22, pulse 97, blood pressure 159/102, and 89% O2 saturations on room air.  Work-up is notable for glucose 355, lactic acid 5.4, WBC 15.5 with neutrophilia.  Her COVID-19 PCR is positive.  Influenza AMB PCR is negative.  Urinalysis is negative for urinary tract infection.  Chest x-ray with extensive airspace opacities to the right lung and ill-defined infiltrate to the left base.  She was to be admitted by the hospitalist for further work-up and treatment of acute hypoxic respiratory failure in the setting of COVID-19 pneumonia.  However while she was in the ED she developed acute respiratory distress along with increasing agitation.  Trial of BiPAP and Ativan attempted, however she stated she felt like she was drowning and was having gurgling respirations.  Concern was for flash pulmonary edema.  Given her  respiratory distress and worsening mentation decision was made to emergently intubate.  She was noted to have copious white frothy secretions with intubation.  Further work-up reveals trending BNP 755.9, and high sensitivity troponin 1341 which has trended upward to 2892, procalcitonin less than 0.10, fibrin derivative products 2670.  PCCM is consulted to admit the patient for further work-up and treatment of acute hypoxic respiratory failure in the setting of COVID-19 pneumonia and pulmonary edema, along with elevated troponin concerning for demand ischemia versus NSTEMI.  Cardiology has been consulted and she has been placed on heparin drip.  Given her rapid decline and elevated FDPs, will obtain CTA chest to rule out pulmonary embolism.  EVENTS:  11/30/20- Passed SBT and extubated 12/01/20- optimizing for downgrade to [place], advancing diet. Patient on 2L/[place].  -Signed out to Dr Blaine Hamper   Past Medical History:  Migraines  Significant Hospital Events:  1/14: Presented to ED; rapidly deteriorated while in ED requiring emergent intubation  Consults:  PCCM Cardiology  Procedures:  1/14: Endotracheal intubation  Significant Diagnostic Tests:  1/14: CXR>>Dense consolidative and streaky opacities throughout the right lung with more mild opacities in the left lung base as well, can reflect a multifocal infection/pneumonia in the appropriate clinical Setting. 1/14: Echocardiogram>> 1/14: CTA Chest>>  Micro Data:  1/14: SARS-CoV-2 PCR>> Positive 1/14: Influenza A&B PCR>>negative 1/14: Blood culture x2>> 1/14: Urine>> 1/14: Tracheal aspirate>> 1/14: Strep pneumo urinary antigen>> 1/14: Legionella Urinary antigen>>  Antimicrobials:  Remdesivir 1/14>> Azithromycin 1/14>> Ceftriaxone 1/14>>  Interim History / Subjective:  Rapidly deteriorated earlier this morning (acute respiratory distress and AMS) requiring emergent intubation in  ED, concern for flash pulmonary edema Currently intubated  and sedated on Propofol Hemodynamically stable   Objective   Blood pressure 135/71, pulse 69, temperature 98.96 F (37.2 C), resp. rate 14, height 5\' 8"  (1.727 m), weight 90.5 kg, SpO2 96 %.        Intake/Output Summary (Last 24 hours) at 12/01/2020 1357 Last data filed at 12/01/2020 1205 Gross per 24 hour  Intake 434.75 ml  Output 2355 ml  Net -1920.25 ml   Filed Weights   11/29/20 0525 11/30/20 0500 12/01/20 0500  Weight: 88.4 kg 90.7 kg 90.5 kg    Examination: General: Critically ill appearing female, laying in bed, intubated and sedated, in NAD HENT: Atraumatic, normocephalic, neck supple, no JVD Lungs: decreased air entry at bases b/l with MV sounds in background Cardiovascular: Tachycardia, regular rhythm, 2+ distal pulses Abdomen: Obese, soft, nontender, nondistended, no guarding or rebound tenderness Extremities: No deformties, 1+ bilateral LE edema Neuro: Sedated, withdraws from pain, pupils PERRL Skin: Warm and dry.  No obvious rashes, lesions, or ulcerations  Resolved Hospital Problem list   N/A  Assessment & Plan:   Acute Hypoxic Respiratory Failure in the setting of COVID-19 Pneumonia & Pulmonary Edema --liberated from mechanical ventilation 11/30/20 -Remdesivir, plan for 5 days -Steroids (Solumedrol 60 mg BID) -Follow inflammatory markers: Ferritin, D-dimer, CRP, IL-6, LDH -Assess for possible tocilizumab  -Vitamin C, zinc -Plan to repeat and check resp cultures   Elevated Troponin, demand ischemia vs. NSTEMI Pulmonary Edema (no known history of CHF) Lactic acidosis Hypertension -Continuous cardiac monitoring -Maintain MAP >65 -Received 40 mg IV Lasix in ED -Further diuresis as BP and renal function permits -Trend troponin -Trend lactic acid -Place on Heparin gtt -Cardiology consulted, appreciate input -Echocardiogram pending -Elevated FDP's (2670), discussed with    COVID-19 Pneumonia -Monitor fever curve -Trend WBC's &  Procalcitonin -Follow cultures as above -Remdesivir, plan for 5 day course -No Baricitinib for now due to ?  bacterial PNA -Continue Azithromycin & Rocephin for now pending cultures & sensitivities -Maintain Airborne & Contact Precautions   Hyperglycemia -CBG's -SSI -Follow ICU Hypo/hyperglycemia protocol -Check Hgb A1c>> 6.1   Best practice (evaluated daily)  Diet: NPO Pain/Anxiety/Delirium protocol (if indicated): Fentanyl and propofol VAP protocol (if indicated): yes DVT prophylaxis: Heparin gtt GI prophylaxis: Pepcid Glucose control: SSI Mobility: Bedrest Disposition:ICU  Goals of Care:  Last date of multidisciplinary goals of care discussion:N/A Family and staff present: Updated pt's father Meher Kucinski via Telephone 11/30/19 Summary of discussion: Critical illness w/ covid requiring intubation, ? Superimposed bacterial PNA and PE Follow up goals of care discussion due: 11/30/20 Code Status: Full code  Labs   CBC: Recent Labs  Lab 11/29/20 0600 11/30/20 0510 12/01/20 0520  WBC 15.5* 25.9* 21.8*  NEUTROABS 14.1* 24.3* 19.5*  HGB 16.1* 15.1* 13.7  HCT 47.7* 44.3 39.7  MCV 88.5 88.4 89.2  PLT 438* 441* 062    Basic Metabolic Panel: Recent Labs  Lab 11/29/20 0533 11/30/20 0220 12/01/20 0520  NA 138 139 138  K 4.0 4.0 3.7  CL 104 104 101  CO2 20* 24 27  GLUCOSE 355* 342* 191*  BUN 13 24* 21*  CREATININE 0.71 1.03* 0.60  CALCIUM 8.8* 8.5* 8.2*  MG  --  1.8 2.0  PHOS  --  1.8* 3.8   GFR: Estimated Creatinine Clearance: 97.8 mL/min (by C-G formula based on SCr of 0.6 mg/dL). Recent Labs  Lab 11/29/20 0600 11/29/20 0730 11/29/20 0926 11/29/20 0942 11/29/20 1318 11/30/20 0220 11/30/20 0510  12/01/20 0520  PROCALCITON  --   --  <0.10  --   --  1.19  --  0.73  WBC 15.5*  --   --   --   --   --  25.9* 21.8*  LATICACIDVEN 5.4* 4.0*  --  4.8* 4.5*  --   --   --     Liver Function Tests: Recent Labs  Lab 11/29/20 0533 11/30/20 0220 12/01/20 0520   AST 40 33 27  ALT 33 25 25  ALKPHOS 78 66 50  BILITOT 0.7 0.6 0.6  PROT 7.3 6.0* 5.7*  ALBUMIN 3.8 2.9* 2.8*   Recent Labs  Lab 11/29/20 0533  LIPASE 23   No results for input(s): AMMONIA in the last 168 hours.  ABG    Component Value Date/Time   PHART 7.42 11/29/2020 1600   PCO2ART 35 11/29/2020 1600   PO2ART 110 (H) 11/29/2020 1600   HCO3 22.7 11/29/2020 1600   ACIDBASEDEF 1.1 11/29/2020 1600   O2SAT 98.4 11/29/2020 1600     Coagulation Profile: Recent Labs  Lab 11/29/20 0926  INR 1.0    Cardiac Enzymes: No results for input(s): CKTOTAL, CKMB, CKMBINDEX, TROPONINI in the last 168 hours.  HbA1C: Hgb A1c MFr Bld  Date/Time Value Ref Range Status  11/29/2020 07:15 AM 6.1 (H) 4.8 - 5.6 % Final    Comment:    (NOTE) Pre diabetes:          5.7%-6.4%  Diabetes:              >6.4%  Glycemic control for   <7.0% adults with diabetes     CBG: Recent Labs  Lab 11/30/20 1941 11/30/20 2313 12/01/20 0348 12/01/20 0805 12/01/20 1117  GLUCAP 204* 131* 160* 143* 166*    Review of Systems:   Unable to assess due to intubation, sedation, and critical illness  Past Medical History:  She,  has a past medical history of Cardiomyopathy ([place]), COVID-19 virus infection (11/2020), and Migraines.   Surgical History:  History reviewed. No pertinent surgical history.   Social History:   reports that she quit smoking about 21 years ago. Her smoking use included cigarettes. She has never used smokeless tobacco. She reports current alcohol use. She reports current drug use. Drug: Marijuana.   Family History:  Her family history is not on file.   Allergies Allergies  Allergen Reactions  . Penicillins Rash     Home Medications  Prior to Admission medications   Medication Sig Start Date End Date Taking? Authorizing Provider  diphenhydrAMINE (BENADRYL) 25 mg capsule Take 100 mg by mouth every 6 (six) hours as needed. Sleep and congestion    [provider]   ibuprofen (ADVIL,MOTRIN) 800 MG tablet Take 1 tablet (800 mg total) by mouth 3 (three) times daily. 11/29/12   Kirichenko, Lahoma Rocker, PA-C  SUMAtriptan (IMITREX) 100 MG tablet Take 100 mg by mouth every 2 (two) hours as needed. Migraines    [provider]     Critical care time: 32 minutes      Critical care provider statement:    Critical care time (minutes):  32   Critical care time was exclusive of:  Separately billable procedures and  treating other patients   Critical care was necessary to treat or prevent imminent or  life-threatening deterioration of the following conditions:  acute hypoxemic respiratory failure, COVID19   Critical care was time spent personally by me on the following  activities:  Development of treatment plan  with patient or surrogate,  discussions with consultants, evaluation of patient's response to  treatment, examination of patient, obtaining history from patient or  surrogate, ordering and performing treatments and interventions, ordering  and review of laboratory studies and re-evaluation of patient's condition   I assumed direction of critical care for this patient from another  provider in my specialty: no       Ottie Glazier, M.D.  [facility] [place]

## 2020-12-01 NOTE — Plan of Care (Signed)
Pt has remained alert and oriented x 4 with no c/o pain, afebrile. Pt has remained in NSR on cardiac monitor, HR/BP WNL. Pt transitioned to RA early on in the shift and tolerated well; however, pt became anxious and requested oxygen-> 2LNC reapplied. SpO2 remained >90%, lung sounds clear to ausculation, NDN. Pt has requested to stay in the bed today, despite frequent attempts to assist OOB to recliner. Pt had one episode of N/V after clear liquids->zofran given x1. Pt states she believes it is related to sugary intake. Pt has tolerated an advance in diet to carb modified, NDN. Pt with orders to transfer to med-surg.   Problem: Education: Goal: Knowledge of General Education information will improve Description: Including pain rating scale, medication(s)/side effects and non-pharmacologic comfort measures 12/01/2020 1646 by Fanny Bien, RN Outcome: Progressing 12/01/2020 1644 by Fanny Bien, RN Outcome: Progressing   Problem: Activity: Goal: Risk for activity intolerance will decrease Outcome: Progressing   Problem: Nutrition: Goal: Adequate nutrition will be maintained Outcome: Progressing   Problem: Coping: Goal: Level of anxiety will decrease 12/01/2020 1646 by Fanny Bien, RN Outcome: Progressing 12/01/2020 1644 by Fanny Bien, RN Outcome: Progressing   Problem: Pain Managment: Goal: General experience of comfort will improve Outcome: Progressing   Problem: Safety: Goal: Ability to remain free from injury will improve Outcome: Progressing   Problem: Skin Integrity: Goal: Risk for impaired skin integrity will decrease Outcome: Progressing

## 2020-12-01 NOTE — Progress Notes (Signed)
Pt bradycardic in the 50s, pt has been as low as 48. No changes in mental status and b/p stable. Notified NP.

## 2020-12-01 NOTE — Progress Notes (Signed)
Went to give pt bedtime medication, pt was drowsy and had blank stare and staring off. Pt verbalized she was fine, after nurse asked was she ok. Pt HR in the 50s. After speaking with provider, verified no additional medications were at bedside. Went through personal belongings with patient and documented the belongings at bedside  in chart. No additional medication in patient's possession.

## 2020-12-02 ENCOUNTER — Encounter
Admission: EM | Disposition: A | Discharge status: Home / Self Care | Payer: Self-pay | Source: Home / Self Care | Attending: Pulmonary Disease

## 2020-12-02 ENCOUNTER — Encounter: Payer: Self-pay | Admitting: Certified Registered Nurse Anesthetist

## 2020-12-02 ENCOUNTER — Encounter: Payer: Self-pay | Admitting: Cardiovascular Disease

## 2020-12-02 DIAGNOSIS — I214 Non-ST elevation (NSTEMI) myocardial infarction: Secondary | ICD-10-CM

## 2020-12-02 DIAGNOSIS — I5021 Acute systolic (congestive) heart failure: Secondary | ICD-10-CM

## 2020-12-02 HISTORY — PX: RIGHT/LEFT HEART CATH AND CORONARY ANGIOGRAPHY: CATH118266

## 2020-12-02 LAB — CBC WITH DIFFERENTIAL/PLATELET
Abs Immature Granulocytes: 0.09 10*3/uL — ABNORMAL HIGH (ref 0.00–0.07)
Basophils Absolute: 0 10*3/uL (ref 0.0–0.1)
Basophils Relative: 0 %
Eosinophils Absolute: 0 10*3/uL (ref 0.0–0.5)
Eosinophils Relative: 0 %
HCT: 41.3 % (ref 36.0–46.0)
Hemoglobin: 14.3 g/dL (ref 12.0–15.0)
Immature Granulocytes: 1 %
Lymphocytes Relative: 5 %
Lymphs Abs: 0.8 10*3/uL (ref 0.7–4.0)
MCH: 30.6 pg (ref 26.0–34.0)
MCHC: 34.6 g/dL (ref 30.0–36.0)
MCV: 88.2 fL (ref 80.0–100.0)
Monocytes Absolute: 0.5 10*3/uL (ref 0.1–1.0)
Monocytes Relative: 3 %
Neutro Abs: 14.5 10*3/uL — ABNORMAL HIGH (ref 1.7–7.7)
Neutrophils Relative %: 91 %
Platelets: 283 10*3/uL (ref 150–400)
RBC: 4.68 MIL/uL (ref 3.87–5.11)
RDW: 13.7 % (ref 11.5–15.5)
WBC: 15.9 10*3/uL — ABNORMAL HIGH (ref 4.0–10.5)
nRBC: 0 % (ref 0.0–0.2)

## 2020-12-02 LAB — C-REACTIVE PROTEIN: CRP: 1.6 mg/dL — ABNORMAL HIGH (ref <1.0)

## 2020-12-02 LAB — PHOSPHORUS: Phosphorus: 3.4 mg/dL (ref 2.5–4.6)

## 2020-12-02 LAB — MAGNESIUM: Magnesium: 2.2 mg/dL (ref 1.7–2.4)

## 2020-12-02 LAB — COMPREHENSIVE METABOLIC PANEL
ALT: 25 U/L (ref 0–44)
AST: 21 U/L (ref 15–41)
Albumin: 3 g/dL — ABNORMAL LOW (ref 3.5–5.0)
Alkaline Phosphatase: 51 U/L (ref 38–126)
Anion gap: 7 (ref 5–15)
BUN: 22 mg/dL — ABNORMAL HIGH (ref 6–20)
CO2: 29 mmol/L (ref 22–32)
Calcium: 8.4 mg/dL — ABNORMAL LOW (ref 8.9–10.3)
Chloride: 101 mmol/L (ref 98–111)
Creatinine, Ser: 0.73 mg/dL (ref 0.44–1.00)
GFR, Estimated: >60 mL/min (ref >60)
Glucose, Bld: 212 mg/dL — ABNORMAL HIGH (ref 70–99)
Potassium: 3.7 mmol/L (ref 3.5–5.1)
Sodium: 137 mmol/L (ref 135–145)
Total Bilirubin: 0.5 mg/dL (ref 0.3–1.2)
Total Protein: 6.1 g/dL — ABNORMAL LOW (ref 6.5–8.1)

## 2020-12-02 LAB — GLUCOSE, CAPILLARY
Glucose-Capillary: 219 mg/dL — ABNORMAL HIGH (ref 70–99)
Glucose-Capillary: 194 mg/dL — ABNORMAL HIGH (ref 70–99)
Glucose-Capillary: 224 mg/dL — ABNORMAL HIGH (ref 70–99)
Glucose-Capillary: 161 mg/dL — ABNORMAL HIGH (ref 70–99)
Glucose-Capillary: 219 mg/dL — ABNORMAL HIGH (ref 70–99)
Glucose-Capillary: 146 mg/dL — ABNORMAL HIGH (ref 70–99)

## 2020-12-02 LAB — TRIGLYCERIDES: Triglycerides: 72 mg/dL (ref <150)

## 2020-12-02 LAB — CULTURE, RESPIRATORY W GRAM STAIN: Culture: NO GROWTH

## 2020-12-02 LAB — FERRITIN: Ferritin: 98 ng/mL (ref 11–307)

## 2020-12-02 LAB — FIBRIN DERIVATIVES D-DIMER (ARMC ONLY): Fibrin derivatives D-dimer (ARMC): 434.47 ng/mL (FEU) (ref 0.00–499.00)

## 2020-12-02 LAB — HEPARIN LEVEL (UNFRACTIONATED): Heparin Unfractionated: 0.33 IU/mL (ref 0.30–0.70)

## 2020-12-02 SURGERY — RIGHT/LEFT HEART CATH AND CORONARY ANGIOGRAPHY
Anesthesia: Moderate Sedation

## 2020-12-02 MED ORDER — MIDAZOLAM HCL 2 MG/2ML IJ SOLN
INTRAMUSCULAR | Status: AC
  Filled 2020-12-02: qty 2

## 2020-12-02 MED ORDER — FENTANYL CITRATE (PF) 100 MCG/2ML IJ SOLN
INTRAMUSCULAR | Status: DC | PRN
  Administered 2020-12-02: 25 ug via INTRAVENOUS

## 2020-12-02 MED ORDER — ASPIRIN 81 MG PO CHEW: 81.0000 mg | CHEWABLE_TABLET | ORAL | Status: DC

## 2020-12-02 MED ORDER — SODIUM CHLORIDE 0.9% FLUSH: 3.0000 mL | INTRAVENOUS | Status: DC | PRN

## 2020-12-02 MED ORDER — HEPARIN (PORCINE) IN NACL 1000-0.9 UT/500ML-% IV SOLN
INTRAVENOUS | Status: DC | PRN
  Administered 2020-12-02: 500 mL

## 2020-12-02 MED ORDER — SODIUM CHLORIDE 0.9% FLUSH: 3.0000 mL | Freq: Two times a day (BID) | INTRAVENOUS | Status: DC

## 2020-12-02 MED ORDER — ENSURE MAX PROTEIN PO LIQD
11.0000 [oz_av] | Freq: Two times a day (BID) | ORAL | Status: DC
  Administered 2020-12-02: 11 [oz_av] via ORAL
  Filled 2020-12-02: qty 330

## 2020-12-02 MED ORDER — FENTANYL CITRATE (PF) 100 MCG/2ML IJ SOLN
INTRAMUSCULAR | Status: AC
  Filled 2020-12-02: qty 2

## 2020-12-02 MED ORDER — IOHEXOL 300 MG/ML  SOLN
INTRAMUSCULAR | Status: DC | PRN
  Administered 2020-12-02: 90 mL

## 2020-12-02 MED ORDER — HEPARIN SODIUM (PORCINE) 1000 UNIT/ML IJ SOLN
INTRAMUSCULAR | Status: DC | PRN
  Administered 2020-12-02: 4500 [IU] via INTRAVENOUS

## 2020-12-02 MED ORDER — ENOXAPARIN SODIUM 40 MG/0.4ML ~~LOC~~ SOLN
40.0000 mg | SUBCUTANEOUS | Status: DC
  Administered 2020-12-03: 40 mg via SUBCUTANEOUS
  Filled 2020-12-02 (×3): qty 0.4

## 2020-12-02 MED ORDER — VERAPAMIL HCL 2.5 MG/ML IV SOLN
INTRAVENOUS | Status: AC
  Filled 2020-12-02: qty 2

## 2020-12-02 MED ORDER — GLIPIZIDE ER 5 MG PO TB24
5.0000 mg | ORAL_TABLET | Freq: Every day | ORAL | Status: DC
  Filled 2020-12-02: qty 1

## 2020-12-02 MED ORDER — MIDAZOLAM HCL 2 MG/2ML IJ SOLN
INTRAMUSCULAR | Status: DC | PRN
  Administered 2020-12-02: 1 mg via INTRAVENOUS

## 2020-12-02 MED ORDER — SODIUM CHLORIDE 0.9 % IV SOLN: 250.0000 mL | INTRAVENOUS | Status: DC | PRN

## 2020-12-02 MED ORDER — SODIUM CHLORIDE 0.9 % IV SOLN: INTRAVENOUS | Status: DC

## 2020-12-02 MED ORDER — VERAPAMIL HCL 2.5 MG/ML IV SOLN
INTRAVENOUS | Status: DC | PRN
  Administered 2020-12-02: 2.5 mg via INTRA_ARTERIAL

## 2020-12-02 MED ORDER — CARVEDILOL 3.125 MG PO TABS
3.1250 mg | ORAL_TABLET | Freq: Two times a day (BID) | ORAL | Status: DC
  Administered 2020-12-03: 3.125 mg via ORAL
  Filled 2020-12-02 (×6): qty 1

## 2020-12-02 MED ORDER — HEPARIN SODIUM (PORCINE) 1000 UNIT/ML IJ SOLN
INTRAMUSCULAR | Status: AC
  Filled 2020-12-02: qty 1

## 2020-12-02 MED ORDER — HEPARIN (PORCINE) IN NACL 1000-0.9 UT/500ML-% IV SOLN
INTRAVENOUS | Status: AC
  Filled 2020-12-02: qty 1000

## 2020-12-02 SURGICAL SUPPLY — 12 items
CATH BALLN WEDGE 5F 110CM (CATHETERS) ×1 IMPLANT
CATH INFINITI 5 FR JL3.5 (CATHETERS) ×1 IMPLANT
CATH INFINITI 5FR ANG PIGTAIL (CATHETERS) ×1 IMPLANT
CATH INFINITI 5FR JK (CATHETERS) ×1 IMPLANT
CATH INFINITI JR4 5F (CATHETERS) ×1 IMPLANT
DEVICE RAD TR BAND REGULAR (VASCULAR PRODUCTS) ×1 IMPLANT
GLIDESHEATH SLEND SS 6F .021 (SHEATH) ×1 IMPLANT
GUIDEWIRE INQWIRE 1.5J.035X260 (WIRE) IMPLANT
INQWIRE 1.5J .035X260CM (WIRE) ×2
KIT MANI 3VAL PERCEP (MISCELLANEOUS) ×2 IMPLANT
PACK CARDIAC CATH (CUSTOM PROCEDURE TRAY) ×2 IMPLANT
SHEATH GLIDE SLENDER 4/5FR (SHEATH) ×1 IMPLANT

## 2020-12-02 NOTE — Progress Notes (Signed)
R Radial TR band removed. Site clean, no hematoma. No deficits. Dressing placed.

## 2020-12-02 NOTE — Progress Notes (Signed)
[place] for heparin Indication: chest pain/ACS  Allergies  Allergen Reactions  . Penicillins Rash   Patient Measurements: Height: 5\' 8"  (172.7 cm) Weight: 90.1 kg (198 lb 10.2 oz) IBW/kg (Calculated) : 63.9 Heparin Dosing Weight: 82.4 kg  Vital Signs: Temp: 98.06 F (36.7 C) (01/17 0500) BP: 123/67 (01/17 0200) Pulse Rate: 64 (01/17 0500)  Labs: Recent Labs    11/29/20 0926 11/29/20 1135 11/29/20 1318 11/29/20 1724 11/30/20 0106 11/30/20 0220 11/30/20 0510 12/01/20 0520 12/02/20 0624  HGB  --   --   --   --    < >  --  15.1* 13.7 14.3  HCT  --   --   --   --   --   --  44.3 39.7 41.3  PLT  --   --   --   --   --   --  441* 293 283  APTT 30  --   --   --   --   --   --   --   --   LABPROT 12.7  --   --   --   --   --   --   --   --   INR 1.0  --   --   --   --   --   --   --   --   HEPARINUNFRC  --   --   --    < >  --   --  0.66 0.66 0.33  CREATININE  --   --   --   --   --  1.03*  --  0.60  --   TROPONINIHS 1,702* 2,892* 4,037*  --   --   --   --   --   --    < > = values in this interval not displayed.    Estimated Creatinine Clearance: 97.7 mL/min (by C-G formula based on SCr of 0.6 mg/dL).   Medical History: Past Medical History:  Diagnosis Date  . Cardiomyopathy ([place])    a. 11/2020 Echo: EF 20-25%, no rwma, glob HK, nl RV size/fxn.  Mar[place] Kitchen COVID-19 virus infection 11/2020  . Migraines    Assessment: 50 year old female admitted with acute hypoxic respiratory failure s/t Covid and pulmonary edema. Patient failed BiPAP, requiring intubation. Pharmacy consult for heparin drip for ACS/STEMI.  1/14 1300 INITIAL =Heparin 4000 unit bolus followed by drip at 1050 units/hr.  1/14 1754 HL = < 0.10 -- per nurse has been on hold for line placement 1/15 0106 HL 0.46, therapeutic x 1 01/15 0510  HL=0.66, Heparin therapeutic,cont current rate 0117 0624 HL 0.33, therapeutic  Goal of Therapy:  Heparin level 0.3-0.7  units/ml Monitor platelets by anticoagulation protocol: Yes   Plan:  01/17 0624  HL=0.33, Heparin therapeutic x 3. Continue Heparin at current rate. Will f/u HL with am labs.  CBC daily while on heparin drip.  Ena Dawley, PharmD Clinical Pharmacist  12/02/2020,7:04 AM

## 2020-12-02 NOTE — Progress Notes (Signed)
Nutrition Follow Up Note   DOCUMENTATION CODES:   Not applicable  INTERVENTION:   Ensure Max protein supplement BID, each supplement provides 150kcal and 30g of protein.  NUTRITION DIAGNOSIS:   Inadequate oral intake related to inability to eat as evidenced by NPO status.  GOAL:   Patient will meet greater than or equal to 90% of their needs -progressing   MONITOR:   PO intake,Supplement acceptance,Labs,Weight trends,Skin,I & O's  ASSESSMENT:   50 year old female with PMHx of migraines admitted with acute hypoxic respiratory failure in setting of COVID-19 PNA and pulmonary edema, also with NSTEMI.   Pt extubated 1/16. Pt initiated on a carbohydrate controlled diet. Pt is documented to be eating 50-100% of meals in hospital; pt ate 100% of her breakfast this morning. RD will add supplements to help pt meet her estimated needs. Per chart, pt is weight stable since admit.   Medications reviewed and include: vitamin C, aspirin, colace, insulin, solu-medrol, miralax, zinc, azithromycin, ceftriaxone, pepcid, heparin  Labs reviewed: K 3.7 wnl, P 3.4 wnl, Mg 2.2 wnl Wbc-15.9(H) cbgs- 19, 194 x 24 hrs  Diet Order:   Diet Order            Diet Carb Modified Fluid consistency: Thin; Room service appropriate? Yes  Diet effective now                EDUCATION NEEDS:   No education needs have been identified at this time  Skin:  Skin Assessment: Reviewed RN Assessment  Last BM:  1/16- type 7  Height:   Ht Readings from Last 1 Encounters:  11/29/20 5\' 8"  (1.727 m)   Weight:   Wt Readings from Last 1 Encounters:  12/02/20 90.1 kg   Ideal Body Weight:  63.6 kg  BMI:  Body mass index is 30.2 kg/m.  Estimated Nutritional Needs:   Kcal:  1900-2200kcal/day  Protein:  95-110g/day  Fluid:  >/= 2 L/day  Koleen Distance MS, RD, LDN Please refer to [facility] for RD and/or RD on-call/weekend/after hours pager

## 2020-12-02 NOTE — Progress Notes (Signed)
Krystal Gregory at [place] NAME: Krystal Gregory    MR#:  [id number]  DATE OF BIRTH:  [birth year]  SUBJECTIVE:  Picked up from PCCM to follow patient came in with increasing shortness of breath cough of nausea vomiting and diarrhea. She was hypoxic intubated placed on the ventilator. Patient was extubated on 15th January. Since then improving doing well.  Her oxygen saturation 93 to 95% on room air. Good appetite plan for heart catheterization today. REVIEW OF SYSTEMS:   Review of Systems  Constitutional: Negative for chills, fever and weight loss.  HENT: Negative for ear discharge, ear pain and nosebleeds.   Eyes: Negative for blurred vision, pain and discharge.  Respiratory: Positive for shortness of breath. Negative for sputum production, wheezing and stridor.   Cardiovascular: Negative for chest pain, palpitations, orthopnea and PND.  Gastrointestinal: Negative for abdominal pain, diarrhea, nausea and vomiting.  Genitourinary: Negative for frequency and urgency.  Musculoskeletal: Negative for back pain and joint pain.  Neurological: Positive for weakness. Negative for sensory change, speech change and focal weakness.  Psychiatric/Behavioral: Negative for depression and hallucinations. The patient is not nervous/anxious.    Tolerating Diet:yes Tolerating PT: not needed  DRUG ALLERGIES:   Allergies  Allergen Reactions  . Penicillins Rash    VITALS:  Blood pressure 129/65, pulse (!) 58, temperature 99.14 F (37.3 C), resp. rate (!) 27, height 5\' 8"  (1.727 m), weight 90.1 kg, SpO2 93 %.  PHYSICAL EXAMINATION:   Physical Exam  GENERAL:  50 y.o.-year-old patient lying in the bed with no acute distress.  HEENT: Head atraumatic, normocephalic. Oropharynx and nasopharynx clear.  NECK:  Supple, no jugular venous distention. No thyroid enlargement, no tenderness.  LUNGS: decreased breath sounds bilaterally, no wheezing, rales, rhonchi. No use  of accessory muscles of respiration.  CARDIOVASCULAR: S1, S2 normal. No murmurs, rubs, or gallops.  ABDOMEN: Soft, nontender, nondistended. Bowel sounds present. No organomegaly or mass.  EXTREMITIES: No cyanosis, clubbing or edema b/l.    NEUROLOGIC: Cranial nerves II through XII are intact. No focal Motor or sensory deficits b/l.   PSYCHIATRIC:  patient is alert and oriented x 3.  SKIN: No obvious rash, lesion, or ulcer.   LABORATORY PANEL:  CBC Recent Labs  Lab 12/02/20 0624  WBC 15.9*  HGB 14.3  HCT 41.3  PLT 283    Chemistries  Recent Labs  Lab 12/02/20 0624  NA 137  K 3.7  CL 101  CO2 29  GLUCOSE 212*  BUN 22*  CREATININE 0.73  CALCIUM 8.4*  MG 2.2  AST 21  ALT 25  ALKPHOS 51  BILITOT 0.5   Cardiac Enzymes No results for input(s): TROPONINI in the last 168 hours. RADIOLOGY:  No results found. ASSESSMENT AND PLAN:  Krystal Gregory is a 50 year old female with a past medical history notable for migraines who [facility] ED on 11/29/2020 due to complaints of shortness of breath, cough nausea, vomiting, diarrhea, and generalized malaise.  Chest x-ray with extensive airspace opacities to the right lung and ill-defined infiltrate to the left base.  Acute Hypoxic Respiratory Failure in the setting of COVID-19 Pneumonia & Pulmonary Edema --extubated from mechanical ventilation 11/30/20 -Remdesivir, plan for 5 days -Steroids (Solumedrol 60 mg BID) -Follow inflammatory markers: Ferritin, D-dimer, CRP, IL-6, LDH-- trending down-Assess for possible tocilizumab  -Vitamin C, zinc -1/17- sats 93--95% on RA -- empiric IV antibiotics will switch to oral  Non-STEMI/flash pulmonary edema with severe cardiomyopathy--(no prior cardiac history) --  elevated troponin -- received IV Lasix -- cardiology consultation with Krystal. Fletcher Gregory-- plans for heart catheterization today. -- Echo shows Left ventricular ejection fraction, by estimation, is 20 to 25%. The  left ventricle has  severely decreased function. The left ventricle  demonstrates global hypokinesis. Left ventricular diastolic parameters are  indeterminate.  -- Continue aspirin, lisinopril -- continue statins -- on IV heparin drip  Diabetes-type II, hyperglycemia in the setting of steroids, acute illness-- new onset hemoglobin A1c 6.1 -- continue sliding scale -- will start patient on glipizide xl   DVT prophylaxis  on IV heparin drip will change to subcut Lovenox   Procedures: cardiac cath Family communication : Consults :cardiolgoy CODE STATUS: FULL DVT Prophylaxis :heparin gtt  Status is: Inpatient  Remains inpatient appropriate because:Inpatient level of care appropriate due to severity of illness   Dispo: The patient is from: Home              Anticipated d/c is to: Home              Anticipated d/c date is: 2 days              Patient currently is not medically stable to d/c.  cardiac cath today improving from [place]: 25 minutes.  >50% time spent on counselling and coordination of care  Note: This dictation was prepared with Dragon dictation along with smaller phrase technology. Any transcriptional errors that result from this process are unintentional.  Krystal Gregory M.D    [facility]   CC: Primary care physician; Krystal Gregory, MDPatient ID: Krystal Gregory, female   DOB: 1971/10/04, 50 y.o.   MRN: [id number]

## 2020-12-02 NOTE — Progress Notes (Signed)
Chart reviewed. Attempted to see Pt. NPO for heart cath later today. Nsg reports Pt is tolerating current carb diet without difficulty. ST to f/u tomorrow.

## 2020-12-02 NOTE — Progress Notes (Signed)
Progress Note  Patient Name: Krystal Gregory Date of Encounter: 12/02/2020  [facility] Cardiologist: Kathlyn Sacramento, MD   Subjective   She was extubated yesterday and feels well overall with no chest pain or significant dyspnea.  Inpatient Medications    Scheduled Meds: . vitamin C  500 mg Per Tube Daily  . aspirin EC  81 mg Oral Daily  . atorvastatin  40 mg Oral Daily  . chlorhexidine  15 mL Mouth Rinse BID  . Chlorhexidine Gluconate Cloth  6 each Topical Daily  . docusate  100 mg Per Tube BID  . guaiFENesin  5 mL Oral Once  . insulin aspart  0-20 Units Subcutaneous Q4H  . lisinopril  5 mg Oral Daily  . mouth rinse  15 mL Mouth Rinse q12n4p  . methylPREDNISolone (SOLU-MEDROL) injection  40 mg Intravenous Q12H  . polyethylene glycol  17 g Per Tube Daily  . Ensure Max Protein  11 oz Oral BID  . zinc sulfate  220 mg Per Tube Daily   Continuous Infusions: . sodium chloride 250 mL (12/01/20 1457)  . azithromycin 500 mg (12/02/20 0938)  . cefTRIAXone (ROCEPHIN)  IV 2 g (12/02/20 0550)  . famotidine (PEPCID) IV Stopped (12/01/20 2320)  . heparin 1,050 Units/hr (12/02/20 1829)  . norepinephrine Stopped (11/30/20 1021)  . remdesivir 100 mg in NS 100 mL 100 mg (12/02/20 0833)  . sodium chloride     PRN Meds: acetaminophen, albuterol, dextromethorphan-guaiFENesin, diphenhydrAMINE, docusate, hydrALAZINE, loperamide, midazolam, midazolam, ondansetron (ZOFRAN) IV, polyethylene glycol, promethazine   Vital Signs    Vitals:   12/02/20 0500 12/02/20 0700 12/02/20 0800 12/02/20 0847  BP:   (!) 111/92 (!) 111/92  Pulse: 64 (!) 56 88   Resp: (!) 3 (!) 7 20   Temp: 98.06 F (36.7 C) 98.42 F (36.9 C) 98.78 F (37.1 C)   TempSrc:   Bladder   SpO2: 95% 97% 91%   Weight: 90.1 kg     Height:        Intake/Output Summary (Last 24 hours) at 12/02/2020 1037 Last data filed at 12/02/2020 9371 Gross per 24 hour  Intake 1540 ml  Output 4090 ml  Net -2550 ml   Last 3 Weights  12/02/2020 12/01/2020 11/30/2020  Weight (lbs) 198 lb 10.2 oz 199 lb 8.3 oz 199 lb 15.3 oz  Weight (kg) 90.1 kg 90.5 kg 90.7 kg      Telemetry    Sinus rhythm- Personally Reviewed  ECG    No new tracing- Personally Reviewed  Physical Exam   GEN: No acute distress.   Neck: No JVD Cardiac: RRR, no murmurs, rubs, or gallops.  Respiratory: Clear to auscultation anteriorly, decreased breath sounds at bases. GI: Soft, nontender, non-distended  MS: No edema; No deformity. Neuro:  Nonfocal  Psych: Normal affect  Right radial pulses normal.  Labs    High Sensitivity Troponin:   Recent Labs  Lab 11/29/20 0600 11/29/20 0926 11/29/20 1135 11/29/20 1318  TROPONINIHS 1,341* 1,702* 2,892* 4,037*      Chemistry Recent Labs  Lab 11/30/20 0220 12/01/20 0520 12/02/20 0624  NA 139 138 137  K 4.0 3.7 3.7  CL 104 101 101  CO2 24 27 29   GLUCOSE 342* 191* 212*  BUN 24* 21* 22*  CREATININE 1.03* 0.60 0.73  CALCIUM 8.5* 8.2* 8.4*  PROT 6.0* 5.7* 6.1*  ALBUMIN 2.9* 2.8* 3.0*  AST 33 27 21  ALT 25 25 25   ALKPHOS 66 50 51  BILITOT 0.6  0.6 0.5  GFRNONAA >60 >60 >60  ANIONGAP 11 10 7      Hematology Recent Labs  Lab 11/30/20 0510 12/01/20 0520 12/02/20 0624  WBC 25.9* 21.8* 15.9*  RBC 5.01 4.45 4.68  HGB 15.1* 13.7 14.3  HCT 44.3 39.7 41.3  MCV 88.4 89.2 88.2  MCH 30.1 30.8 30.6  MCHC 34.1 34.5 34.6  RDW 13.8 13.8 13.7  PLT 441* 293 283    BNP Recent Labs  Lab 11/29/20 0600  BNP 755.9*     DDimer No results for input(s): DDIMER in the last 168 hours.   Radiology    No results found.  Cardiac Studies   TTE 11/29/2020 1. Left ventricular ejection fraction, by estimation, is 20 to 25%. The  left ventricle has severely decreased function. The left ventricle  demonstrates global hypokinesis. Left ventricular diastolic parameters are  indeterminate.  2. Right ventricular systolic function is normal. The right ventricular  size is normal. Tricuspid  regurgitation signal is inadequate for assessing  PA pressure.  3. The mitral valve is normal in structure. No evidence of mitral valve  regurgitation. No evidence of mitral stenosis.  4. The aortic valve is normal in structure. Aortic valve regurgitation is  not visualized. No aortic stenosis is present.  5. The inferior vena cava is dilated in size with <50% respiratory  variability, suggesting right atrial pressure of 15 mmHg.   Patient Profile     50 y.o. female with history of migraines presenting to the hospital with shortness of breath, dizziness.  Diagnosed with respiratory failure in the setting of COVID-19 pneumonia.  Being seen for NSTEMI and severely reduced ejection fraction  Assessment & Plan    1. NSTEMI -Continue aspirin 81 mg, continue Lipitor 40 mg daily. -Obtain lipid panel. -The patient presentation was respiratory distress in the setting of severely elevated blood pressure with what seems to be flash pulmonary edema given the quick improvement in symptoms with diuresis and intubation. We have to exclude underlying coronary ischemia. I recommend a right and left heart catheterization possible PCI. I discussed the procedure in details as well as risk and benefits and she is agreeable to proceed today.  2.  Cardiomyopathy, EF 20 to 25% -Chest x-ray with small bilateral effusions -Planning a right and left cardiac catheterization today. -Continue lisinopril. -Avoiding beta-blockers for now due to low heart rates. -She is off diuretics for now and will determine the need based on right heart cath data.  3. COVID-19 pneumonia -The quick improvement in her symptoms argues against ARDS related to [place]. It appears that most likely it was volume overload.  Total encounter time 35 minutes  Greater than 50% was spent in counseling and coordination of care with the patient.       Signed, Kathlyn Sacramento, MD  12/02/2020, 10:37 AM

## 2020-12-03 DIAGNOSIS — I5021 Acute systolic (congestive) heart failure: Secondary | ICD-10-CM

## 2020-12-03 DIAGNOSIS — I248 Other forms of acute ischemic heart disease: Secondary | ICD-10-CM

## 2020-12-03 LAB — GLUCOSE, CAPILLARY
Glucose-Capillary: 234 mg/dL — ABNORMAL HIGH (ref 70–99)
Glucose-Capillary: 226 mg/dL — ABNORMAL HIGH (ref 70–99)

## 2020-12-03 LAB — CBC
HCT: 49 % — ABNORMAL HIGH (ref 36.0–46.0)
Hemoglobin: 16.5 g/dL — ABNORMAL HIGH (ref 12.0–15.0)
MCH: 30.1 pg (ref 26.0–34.0)
MCHC: 33.7 g/dL (ref 30.0–36.0)
MCV: 89.4 fL (ref 80.0–100.0)
Platelets: 385 10*3/uL (ref 150–400)
RBC: 5.48 MIL/uL — ABNORMAL HIGH (ref 3.87–5.11)
RDW: 13.1 % (ref 11.5–15.5)
WBC: 15.7 10*3/uL — ABNORMAL HIGH (ref 4.0–10.5)
nRBC: 0 % (ref 0.0–0.2)

## 2020-12-03 LAB — LEGIONELLA PNEUMOPHILA SEROGP 1 UR AG: L. pneumophila Serogp 1 Ur Ag: NEGATIVE

## 2020-12-03 MED ORDER — PREDNISONE 10 MG PO TABS: ORAL_TABLET | ORAL | 0 refills | Status: DC

## 2020-12-03 MED ORDER — PREDNISONE 50 MG PO TABS
50.0000 mg | ORAL_TABLET | Freq: Every day | ORAL | Status: DC
  Administered 2020-12-03: 09:00:00 50 mg via ORAL
  Filled 2020-12-03: qty 1

## 2020-12-03 MED ORDER — CEFDINIR 300 MG PO CAPS
300.0000 mg | ORAL_CAPSULE | Freq: Two times a day (BID) | ORAL | Status: DC
  Filled 2020-12-03: qty 1

## 2020-12-03 MED ORDER — ALBUTEROL SULFATE HFA 108 (90 BASE) MCG/ACT IN AERS: 2.0000 | INHALATION_SPRAY | RESPIRATORY_TRACT | 0 refills | Status: AC | PRN

## 2020-12-03 MED ORDER — LISINOPRIL 10 MG PO TABS: 10.0000 mg | ORAL_TABLET | Freq: Every day | ORAL | 2 refills | Status: DC

## 2020-12-03 MED ORDER — DOXYCYCLINE HYCLATE 100 MG PO TABS
100.0000 mg | ORAL_TABLET | Freq: Two times a day (BID) | ORAL | Status: DC
  Administered 2020-12-03: 09:00:00 100 mg via ORAL

## 2020-12-03 MED ORDER — DOXYCYCLINE HYCLATE 100 MG PO TABS
100.0000 mg | ORAL_TABLET | Freq: Two times a day (BID) | ORAL | Status: DC
Start: 2020-12-03 — End: 2020-12-03

## 2020-12-03 MED ORDER — DOXYCYCLINE HYCLATE 100 MG PO TABS: 100.0000 mg | ORAL_TABLET | Freq: Two times a day (BID) | ORAL | 0 refills | Status: AC

## 2020-12-03 MED ORDER — CARVEDILOL 3.125 MG PO TABS: 3.1250 mg | ORAL_TABLET | Freq: Two times a day (BID) | ORAL | 3 refills | Status: DC

## 2020-12-03 MED ORDER — SPIRONOLACTONE 25 MG PO TABS
25.0000 mg | ORAL_TABLET | Freq: Every day | ORAL | Status: DC
  Administered 2020-12-03: 09:00:00 25 mg via ORAL
  Filled 2020-12-03: qty 1

## 2020-12-03 MED ORDER — ATORVASTATIN CALCIUM 40 MG PO TABS: 40.0000 mg | ORAL_TABLET | Freq: Every day | ORAL | 3 refills | Status: DC

## 2020-12-03 MED ORDER — LISINOPRIL 10 MG PO TABS
10.0000 mg | ORAL_TABLET | Freq: Every day | ORAL | Status: DC
  Administered 2020-12-03: 09:00:00 10 mg via ORAL

## 2020-12-03 MED ORDER — BLOOD GLUCOSE MONITOR KIT: PACK | 0 refills | Status: DC

## 2020-12-03 MED ORDER — ASPIRIN 81 MG PO TBEC: 81.0000 mg | DELAYED_RELEASE_TABLET | Freq: Every day | ORAL | 11 refills | Status: DC

## 2020-12-03 NOTE — Discharge Summary (Signed)
[facility] [place] NAME: Krystal Gregory    MR#:  [id number]  DATE OF BIRTH:  03-31-1971  DATE OF ADMISSION:  11/29/2020 ADMITTING PHYSICIAN: Ottie Glazier, MD  DATE OF DISCHARGE: 12/03/2020  PRIMARY CARE PHYSICIAN: Vania Rea, MD    ADMISSION DIAGNOSIS:  Acute respiratory failure with hypoxia ([place]) [J96.01] COVID-19 virus infection [U07.1] Pneumonia due to COVID-19 virus [U07.1, J12.82]  DISCHARGE DIAGNOSIS:  Acute respiratory failure with hypoxia ([place]) [J96.01] Pneumonia due to COVID-19 virus [U07.1, J12.82] NSTEMI--acute Severe non-ischemic cardiomyopathy Acute Systolic CHF  SECONDARY DIAGNOSIS:   Past Medical History:  Diagnosis Date  . Cardiomyopathy ([place])    a. 11/2020 Echo: EF 20-25%, no rwma, glob HK, nl RV size/fxn.  Marland Kitchen COVID-19 virus infection 11/2020  . Migraines     HOSPITAL COURSE:  Krystal Gregory is a 50 year old female with a past medical history notable for migraines who presents to [facility] ED on 11/29/2020 due to complaints of shortness of breath, cough nausea, vomiting, diarrhea, and generalized malaise. Chest x-ray with extensive airspace opacities to the right lung and ill-defined infiltrate to the left base.  Acute Hypoxic Respiratory Failure in the setting of COVID-19 Pneumonia & Pulmonary Edema --extubated from mechanical ventilation 11/30/20 -Remdesivir, plan for 5 days -Steroids (Solumedrol 60 mg BID)--change to po taper -Follow inflammatory markers: Ferritin, D-dimer, CRP, IL-6, LDH-- trending down -Vitamin C, zinc -1/17- sats 93--95% on RA -- empiric IV antibiotics will switch to oral doxycycline  Non-STEMI/flash pulmonary edema with severe cardiomyopathy--(no prior cardiac history) -- elevated troponin -- received IV Lasix--sats >92% onRA -- cardiology consultation with Dr. Fletcher Anon-- s/p heart catheterization 1.  Near normal coronary arteries with no evidence of obstructive disease.  Superior takeoff  of the left main coronary artery with slit-like appearance of the ostium.  However, this does not appear to be obstructive and doubt clinical significance of this. 2.  Moderate severely reduced LV systolic function with an EF of 30 to 35% with global hypokinesis. 3.  Right heart catheterization showed normal filling pressures, normal pulmonary pressure and normal cardiac output. Recommendations: The patient seems to have nonischemic cardiomyopathy with some improvement in ejection fraction compared to her echocardiogram from 3 days ago.  Recommend medical therapy.  I added small dose carvedilol.  Continue lisinopril and consider adding spironolactone as out pt--per dr End  -- Echo shows Left ventricular ejection fraction, by estimation, is 20 to 25%. The  left ventricle has severely decreased function. The left ventricle  demonstrates global hypokinesis. Left ventricular diastolic parameters are  indeterminate.  -- Continue aspirin, lisinopril -- continue statins --now d/ced IV heparin drip  Diabetes-type II, hyperglycemia in the setting of steroids, acute illness-- new onset hemoglobin A1c 6.1 -- continue sliding scale -- pt would like to monitor sugars and diet at home. Defer rx to PCP depending on sugars as out pt  Overall improving. Ok to d/c home from cardiac standpoint. Pt agreeable  Procedures: cardiac cath Family communication :none Consults :cardiolgoy CODE STATUS: FULL DVT Prophylaxis :heparin gtt  Status is: Inpatient  Dispo: The patient is from: Home  Anticipated d/c is to: Home  Anticipated d/c date is: today  Patient currently is  medically improving and stable to d/c.    CONSULTS OBTAINED:  Treatment Team:  Wellington Hampshire, MD  DRUG ALLERGIES:   Allergies  Allergen Reactions  . Penicillins Rash    DISCHARGE MEDICATIONS:   Allergies as of 12/03/2020      Reactions  Penicillins Rash      Medication List     STOP taking these medications   diphenhydrAMINE 25 mg capsule Commonly known as: BENADRYL   ibuprofen 800 MG tablet Commonly known as: ADVIL   SUMAtriptan 100 MG tablet Commonly known as: IMITREX     TAKE these medications   albuterol 108 (90 Base) MCG/ACT inhaler Commonly known as: VENTOLIN HFA Inhale 2 puffs into the lungs every 4 (four) hours as needed for wheezing or shortness of breath.   aspirin 81 MG EC tablet Take 1 tablet (81 mg total) by mouth daily. Swallow whole. Start taking on: December 04, 2020   atorvastatin 40 MG tablet Commonly known as: LIPITOR Take 1 tablet (40 mg total) by mouth daily. Start taking on: December 04, 2020   blood glucose meter kit and supplies Kit Dispense based on patient and insurance preference. Use up to four times daily as directed. (FOR ICD-9 250.00, 250.01).   carvedilol 3.125 MG tablet Commonly known as: COREG Take 1 tablet (3.125 mg total) by mouth 2 (two) times daily with a meal.   doxycycline 100 MG tablet Commonly known as: VIBRA-TABS Take 1 tablet (100 mg total) by mouth every 12 (twelve) hours for 3 days.   lisinopril 10 MG tablet Commonly known as: ZESTRIL Take 1 tablet (10 mg total) by mouth daily. Start taking on: December 04, 2020   predniSONE 10 MG tablet Commonly known as: DELTASONE Take 50 mg daily--taper by 10 mg daily then stop Start taking on: December 04, 2020       If you experience worsening of your admission symptoms, develop shortness of breath, life threatening emergency, suicidal or homicidal thoughts you must seek medical attention immediately by calling 911 or calling your MD immediately  if symptoms less severe.  You Must read complete instructions/literature along with all the possible adverse reactions/side effects for all the Medicines you take and that have been prescribed to you. Take any new Medicines after you have completely understood and accept all the possible adverse reactions/side  effects.   Please note  You were cared for by a hospitalist during your hospital stay. If you have any questions about your discharge medications or the care you received while you were in the hospital after you are discharged, you can call the unit and asked to speak with the hospitalist on call if the hospitalist that took care of you is not available. Once you are discharged, your primary care physician will handle any further medical issues. Please note that NO REFILLS for any discharge medications will be authorized once you are discharged, as it is imperative that you return to your primary care physician (or establish a relationship with a primary care physician if you do not have one) for your aftercare needs so that they can reassess your need for medications and monitor your lab values. Today   SUBJECTIVE   Doing well. No cp or sob Wants to shower  VITAL SIGNS:  Blood pressure (!) 144/77, pulse 67, temperature 98.4 F (36.9 C), temperature source Oral, resp. rate 18, height 5' 8"  (1.727 m), weight 90.1 kg, SpO2 96 %.  I/O:    Intake/Output Summary (Last 24 hours) at 12/03/2020 0917 Last data filed at 12/02/2020 1800 Gross per 24 hour  Intake --  Output 975 ml  Net -975 ml    PHYSICAL EXAMINATION:  GENERAL:  51 y.o.-year-old patient lying in the bed with no acute distress.  LUNGS: Normal breath sounds bilaterally, no wheezing,  rales,rhonchi or crepitation. No use of accessory muscles of respiration.  CARDIOVASCULAR: S1, S2 normal. No murmurs, rubs, or gallops.  ABDOMEN: Soft, non-tender, non-distended. Bowel sounds present. No organomegaly or mass.  EXTREMITIES: No pedal edema, cyanosis, or clubbing.  NEUROLOGIC: Cranial nerves II through XII are intact. Muscle strength 5/5 in all extremities. Sensation intact. Gait not checked.  PSYCHIATRIC: The patient is alert and oriented x 3.  SKIN: No obvious rash, lesion, or ulcer.   DATA REVIEW:   CBC  Recent Labs  Lab  12/03/20 0459  WBC 15.7*  HGB 16.5*  HCT 49.0*  PLT 385    Chemistries  Recent Labs  Lab 12/02/20 0624  NA 137  K 3.7  CL 101  CO2 29  GLUCOSE 212*  BUN 22*  CREATININE 0.73  CALCIUM 8.4*  MG 2.2  AST 21  ALT 25  ALKPHOS 49  BILITOT 0.5    Microbiology Results   Recent Results (from the past 240 hour(s))  MRSA PCR Screening     Status: None   Collection Time: 11/29/20  5:10 AM   Specimen: Nasopharyngeal  Result Value Ref Range Status   MRSA by PCR NEGATIVE NEGATIVE Final    Comment:        The GeneXpert MRSA Assay (FDA approved for NASAL specimens only), is one component of a comprehensive MRSA colonization surveillance program. It is not intended to diagnose MRSA infection nor to guide or monitor treatment for MRSA infections. Performed at [facility], [address] [place], [place] [address]   Resp Panel by RT-PCR (Flu A&B, Covid) Nasopharyngeal Swab     Status: Abnormal   Collection Time: 11/29/20  6:00 AM   Specimen: Nasopharyngeal Swab; Nasopharyngeal(NP) swabs in vial transport medium  Result Value Ref Range Status   SARS Coronavirus 2 by RT PCR POSITIVE (A) NEGATIVE Final    Comment: RESULT CALLED TO, READ BACK BY AND VERIFIED WITH: [place] AT 0708 11/29/20.PMF (NOTE) SARS-CoV-2 target nucleic acids are DETECTED.  The SARS-CoV-2 RNA is generally detectable in upper respiratory specimens during the acute phase of infection. Positive results are indicative of the presence of the identified virus, but do not rule out bacterial infection or co-infection with other pathogens not detected by the test. Clinical correlation with patient history and other diagnostic information is necessary to determine patient infection status. The expected result is Negative.  Fact Sheet for Patients: EntrepreneurPulse.com.au  Fact Sheet for Healthcare Providers: IncredibleEmployment.be  This test is not yet  approved or cleared by the [place] FDA and  has been authorized for detection and/or diagnosis of SARS-CoV-2 by FDA under an Emergency Use Authorization (EUA).  This EUA will remain in effect (meaning this test can be u sed) for the duration of  the COVID-19 declaration under Section 564(b)(1) of the Act, 21 U.S.C. section 360bbb-3(b)(1), unless the authorization is terminated or revoked sooner.     Influenza A by PCR NEGATIVE NEGATIVE Final   Influenza B by PCR NEGATIVE NEGATIVE Final    Comment: (NOTE) The Xpert Xpress SARS-CoV-2/FLU/RSV plus assay is intended as an aid in the diagnosis of influenza from Nasopharyngeal swab specimens and should not be used as a sole basis for treatment. Nasal washings and aspirates are unacceptable for Xpert Xpress SARS-CoV-2/FLU/RSV testing.  Fact Sheet for Patients: EntrepreneurPulse.com.au  Fact Sheet for Healthcare Providers: IncredibleEmployment.be  This test is not yet approved or cleared by the [place] FDA and has been authorized for detection and/or diagnosis of  SARS-CoV-2 by FDA under an Emergency Use Authorization (EUA). This EUA will remain in effect (meaning this test can be used) for the duration of the COVID-19 declaration under Section 564(b)(1) of the Act, 21 U.S.C. section 360bbb-3(b)(1), unless the authorization is terminated or revoked.  Performed at [facility], [address] [place], [place] [address]   Blood culture (routine x 2)     Status: None (Preliminary result)   Collection Time: 11/29/20  7:15 AM   Specimen: BLOOD  Result Value Ref Range Status   Specimen Description BLOOD LEFT WRIST  Final   Special Requests   Final    BOTTLES DRAWN AEROBIC AND ANAEROBIC Blood Culture adequate volume   Culture   Final    NO GROWTH 4 DAYS Performed at [facility], [address], [place], [place] [address]    Report Status PENDING  Incomplete  Blood  culture (routine x 2)     Status: None (Preliminary result)   Collection Time: 11/29/20  7:30 AM   Specimen: BLOOD  Result Value Ref Range Status   Specimen Description BLOOD LEFT [facility]  Final   Special Requests   Final    BOTTLES DRAWN AEROBIC AND ANAEROBIC Blood Culture adequate volume   Culture   Final    NO GROWTH 4 DAYS Performed at [facility], [address] [place], [place] [address]    Report Status PENDING  Incomplete  Culture, respiratory     Status: None   Collection Time: 11/29/20  4:09 PM   Specimen: Tracheal Aspirate; Respiratory  Result Value Ref Range Status   Specimen Description   Final    TRACHEAL ASPIRATE Performed at [facility], [address] [place], [place] [address]    Special Requests   Final    NONE Performed at [facility], [address] [place], [place] [address]    Gram Stain   Final    RARE WBC PRESENT, PREDOMINANTLY PMN NO ORGANISMS SEEN    Culture   Final    NO GROWTH 2 DAYS Performed at [facility], [place] [address] [place], [place] [address]    Report Status 12/02/2020 FINAL  Final    RADIOLOGY:  CARDIAC CATHETERIZATION  Result Date: 12/02/2020  There is moderate to severe left ventricular systolic dysfunction.  LV end diastolic pressure is normal.  The left ventricular ejection fraction is 25-35% by visual estimate.  There is mild (2+) mitral regurgitation.  1.  Near normal coronary arteries with no evidence of obstructive disease.  Superior takeoff of the left main coronary artery with slit-like appearance of the ostium.  However, this does not appear to be obstructive and doubt clinical significance of this. 2.  Moderate severely reduced LV systolic function with an EF of 30 to 35% with global hypokinesis. 3.  Right heart catheterization showed normal filling pressures, normal pulmonary pressure and normal cardiac output. Recommendations: The patient seems to have nonischemic cardiomyopathy with  some improvement in ejection fraction compared to her echocardiogram from 3 days ago.  Recommend medical therapy.  I added small dose carvedilol.  Continue lisinopril and consider adding spironolactone tomorrow.     CODE STATUS:     Code Status Orders  (From admission, onward)         Start     Ordered   11/29/20 0934  Full code  Continuous        11/29/20 0935        Code Status  History    Date Active Date Inactive Code Status Order ID Comments User Context   08/05/2020 2206 08/07/2020 1403 Full Code [id number]  Naaman Plummer, MD ED   Advance Care Planning Activity       TOTAL TIME TAKING CARE OF THIS PATIENT: *35* minutes.    Fritzi Mandes M.D  [facility]    CC: Primary care physician; Vania Rea, MD

## 2020-12-03 NOTE — Progress Notes (Signed)
Speech Therapy Note: received order, reviewed chart notes. Consulted NSG then pt and family. Pt denied any difficulty swallowing and is currently on a regular diet; tolerates swallowing pills w/ water per NSG. Pt conversed at conversational level w/out deficits noted; pt and family denied any speech-language deficits.  No further skilled ST services indicated as pt appears at her baseline. Pt agreed. NSG to reconsult if any change in status. SLP Cancellation Note  Patient Details Name: Krystal Gregory MRN: [id number] DOB: Feb 28, 1971   Cancelled treatment:       Reason Eval/Treat Not Completed: SLP screened, no needs identified, will sign off (chart reviewed; consulted NSG then met w/ pt) Pt denied any difficulty swallowing and is currently on a regular diet; tolerates swallowing pills w/ water per NSG. Pt conversed at conversational level w/out deficits noted; pt denied any speech-language deficits. Pt was intent on talking about political activism; issues. Able to make her wants/needs known to this SLP. No further skilled ST services indicated as pt appears at her baseline. Pt agreed. NSG to reconsult if any change in status    Orinda Kenner, [place], [employer] Speech Language Pathologist Rehab Services [phone] [facility] 12/03/2020, 11:46 AM

## 2020-12-03 NOTE — Progress Notes (Signed)
Progress Note  Patient Name: Krystal Gregory Date of Encounter: 12/03/2020  [facility] Cardiologist: Kathlyn Sacramento, MD   Subjective   Feels well; no CP, shortness of breath, or edema.  Inpatient Medications    Scheduled Meds: . vitamin C  500 mg Per Tube Daily  . aspirin EC  81 mg Oral Daily  . atorvastatin  40 mg Oral Daily  . carvedilol  3.125 mg Oral BID WC  . chlorhexidine  15 mL Mouth Rinse BID  . Chlorhexidine Gluconate Cloth  6 each Topical Daily  . docusate  100 mg Per Tube BID  . doxycycline  100 mg Oral Q12H  . enoxaparin (LOVENOX) injection  40 mg Subcutaneous Q24H  . glipiZIDE  5 mg Oral Q breakfast  . guaiFENesin  5 mL Oral Once  . insulin aspart  0-20 Units Subcutaneous Q4H  . lisinopril  10 mg Oral Daily  . mouth rinse  15 mL Mouth Rinse q12n4p  . polyethylene glycol  17 g Per Tube Daily  . predniSONE  50 mg Oral Q breakfast  . Ensure Max Protein  11 oz Oral BID  . zinc sulfate  220 mg Per Tube Daily   Continuous Infusions: . famotidine (PEPCID) IV Stopped (12/03/20 0458)  . remdesivir 100 mg in NS 100 mL 100 mg (12/03/20 0855)  . sodium chloride     PRN Meds: acetaminophen, albuterol, dextromethorphan-guaiFENesin, diphenhydrAMINE, docusate, loperamide, ondansetron (ZOFRAN) IV, polyethylene glycol, promethazine, sodium chloride flush   Vital Signs    Vitals:   12/02/20 2212 12/02/20 2223 12/03/20 0444 12/03/20 0824  BP: (!) 135/99 (!) 158/87 (!) 153/77 (!) 144/77  Pulse:  (!) 52 (!) 49 67  Resp:  16 20 18   Temp:  98.4 F (36.9 C) 98.3 F (36.8 C) 98.4 F (36.9 C)  TempSrc:  Oral Oral Oral  SpO2: 100% 97% 96% 96%  Weight:      Height:        Intake/Output Summary (Last 24 hours) at 12/03/2020 0900 Last data filed at 12/02/2020 1800 Gross per 24 hour  Intake --  Output 975 ml  Net -975 ml   Last 3 Weights 12/02/2020 12/01/2020 11/30/2020  Weight (lbs) 198 lb 10.2 oz 199 lb 8.3 oz 199 lb 15.3 oz  Weight (kg) 90.1 kg 90.5 kg 90.7 kg       Telemetry    Not on tele.  ECG   No new tracing.  Physical Exam   GEN: No acute distress.   Neck: No JVD Cardiac: RRR, no murmurs, rubs, or gallops. Right radial cath site clean with minimal bruising.  No hematoma. Respiratory: Clear to auscultation bilaterally. GI: Soft, nontender, non-distended  MS: No edema; No deformity. Neuro:  Nonfocal  Psych: Normal affect   Labs    High Sensitivity Troponin:   Recent Labs  Lab 11/29/20 0600 11/29/20 0926 11/29/20 1135 11/29/20 1318  TROPONINIHS 1,341* 1,702* 2,892* 4,037*      Chemistry Recent Labs  Lab 11/30/20 0220 12/01/20 0520 12/02/20 0624  NA 139 138 137  K 4.0 3.7 3.7  CL 104 101 101  CO2 24 27 29   GLUCOSE 342* 191* 212*  BUN 24* 21* 22*  CREATININE 1.03* 0.60 0.73  CALCIUM 8.5* 8.2* 8.4*  PROT 6.0* 5.7* 6.1*  ALBUMIN 2.9* 2.8* 3.0*  AST 33 27 21  ALT 25 25 25   ALKPHOS 66 50 51  BILITOT 0.6 0.6 0.5  GFRNONAA >60 >60 >60  ANIONGAP 11 10 7  Hematology Recent Labs  Lab 12/01/20 0520 12/02/20 0624 12/03/20 0459  WBC 21.8* 15.9* 15.7*  RBC 4.45 4.68 5.48*  HGB 13.7 14.3 16.5*  HCT 39.7 41.3 49.0*  MCV 89.2 88.2 89.4  MCH 30.8 30.6 30.1  MCHC 34.5 34.6 33.7  RDW 13.8 13.7 13.1  PLT 293 283 385    BNP Recent Labs  Lab 11/29/20 0600  BNP 755.9*     DDimer No results for input(s): DDIMER in the last 168 hours.   Radiology    CARDIAC CATHETERIZATION  Result Date: 12/02/2020  There is moderate to severe left ventricular systolic dysfunction.  LV Yanuel Tagg diastolic pressure is normal.  The left ventricular ejection fraction is 25-35% by visual estimate.  There is mild (2+) mitral regurgitation.  1.  Near normal coronary arteries with no evidence of obstructive disease.  Superior takeoff of the left main coronary artery with slit-like appearance of the ostium.  However, this does not appear to be obstructive and doubt clinical significance of this. 2.  Moderate severely reduced LV systolic  function with an EF of 30 to 35% with global hypokinesis. 3.  Right heart catheterization showed normal filling pressures, normal pulmonary pressure and normal cardiac output. Recommendations: The patient seems to have nonischemic cardiomyopathy with some improvement in ejection fraction compared to her echocardiogram from 3 days ago.  Recommend medical therapy.  I added small dose carvedilol.  Continue lisinopril and consider adding spironolactone tomorrow.    Cardiac Studies   R/LHC (12/02/2020): See above.  TTE (11/29/2020): 1. Left ventricular ejection fraction, by estimation, is 20 to 25%. The  left ventricle has severely decreased function. The left ventricle  demonstrates global hypokinesis. Left ventricular diastolic parameters are  indeterminate.  2. Right ventricular systolic function is normal. The right ventricular  size is normal. Tricuspid regurgitation signal is inadequate for assessing  PA pressure.  3. The mitral valve is normal in structure. No evidence of mitral valve  regurgitation. No evidence of mitral stenosis.  4. The aortic valve is normal in structure. Aortic valve regurgitation is  not visualized. No aortic stenosis is present.  5. The inferior vena cava is dilated in size with <50% respiratory  variability, suggesting right atrial pressure of 15 mmHg.   Patient Profile     50 y.o. female with h/o of migraines presenting to the hospital with shortness of breath, dizziness. Diagnosed with respiratory failure in the setting of COVID-19 pneumonia. Being seen for NSTEMI and severely reduced ejection fraction.  Assessment & Plan    Demand ischemia: Patient with elevated troponin but no significant CAD on catheterization.  I suspect her elevated troponin reflects supply-demand mismatch in the setting of reduced LVEF and severely elevated BP on admission or acute myocarditis secondary to COVID-19.  Continue ASA in the short term for [facility] but favor  discontinuing in the future given lack of significant CAD.  Continue statin therapy, though I will decrease this to 20 mg daily given LDL of 68 on lipid panel 2 days ago.  Acute HFrEF due to NICM: Patient appears euvolemic and is without symptoms today.  Cath yesterday without significant CAD.  Filling pressures were normal.  Continue carvedilol 3.125 mg BID.  Increase lisinopril to 10 mg daily.  Defer adding spironolactone to outpatient setting.  No standing diuretic, though should consider discharge with Rx for furosemide 20 mg daily as needed for weight gain/edema, particularly if patient is to remain on steroids for treatment of COVID-19.  Low sodium diet.  COVID-19:  Per IM.  Wean steroids as quickly as possible to minimize fluid retention in the setting of acute HFrEF.  [facility] will sign off.   Medication Recommendations:  Carvedilol 3.125 mg BID, lisinopril 10 mg daily, atorvastatin 20 mg daily, and aspirin 81 mg daily. Other recommendations (labs, testing, etc):  BMP in 2 weeks at f/u visit. Follow up as an outpatient:  2 weeks with Dr. Fletcher Anon or APP.  For questions or updates, please contact [place] Please consult [email or web address] for contact info under [facility].      Signed, Nelva Bush, MD  12/03/2020, 9:00 AM

## 2020-12-03 NOTE — Discharge Instructions (Signed)
Check your sugars at home periodically and follow carb control diet

## 2020-12-04 LAB — CULTURE, BLOOD (ROUTINE X 2)
Culture: NO GROWTH
Culture: NO GROWTH
Special Requests: ADEQUATE
Special Requests: ADEQUATE

## 2020-12-22 NOTE — Progress Notes (Signed)
Cardiology Office Note:    Date:  12/23/2020   ID:  Krystal Gregory, DOB Jan 31, 1971, MRN [id number]  PCP:  Vania Rea, MD  [facility] Cardiologist:  Kathlyn Sacramento, MD  [place] Electrophysiologist:  None   Referring MD: Vania Rea, MD   Chief Complaint: Hospital follow-up  History of Present Illness:    Krystal Gregory is a 50 y.o. female with a hx of migraines who was recently admitted for respiratory failure in the setting of COVID pna. Cardiology saw for NSTEMI, cardiomyopathy with severely reduced ejection fraction, and severely elevated BP. Echo showed LVEF 20-25%, global hypokinesis, normal RV function. R/LHC showed no significant CAD and normal filling pressures. Troponin elevation was suspected myocarditis 2/2 covid, with severely reduced EF and elevated BP. The patient was started on Aspirin, atorvastatin, coreg, lisinopril.   Today, the patient reports that she is having enormous amount of fatigue. She will go outside and rake only for a couple minutes and have severe exhaustion and sob. She is also having persistent vomiting and diarrhea, which she experienced with covid, but this has continued post-hospitalization. She will eat for a day and then she will have 2 days of nasuea, vomiting, and diarrhea. She has had 3 episodes. The second episode she called EMS who came and did an EKG that was normal, and they thought there was an anxiety component. The patient reports white, watery stool, however last night it was very dark. She had some incontinence as well. She denies bloody vomit or BRBPR. During the episodes she still tries to drink and eat, but cannot keep anything down. No abdominal pain. On the days she can keep things down her energy is good.  This morning she had dry heaving and then had an ensure. She has lost weight 200lbs>188lbs. BP is low today, which the patient says is completely normal. The patient was only taking coreg once a day (she did not realize it was  twice a day). She is occasionally dizzy and lightheaded which is likely from the extreme vomiting/diarrhea. She is also tachycardic today, which is likely from dehydration. She does not check her vitals regularly at home.    Past Medical History:  Diagnosis Date  . Cardiomyopathy ([place])    a. 11/2020 Echo: EF 20-25%, no rwma, glob HK, nl RV size/fxn.  Marland Kitchen COVID-19 virus infection 11/2020  . Migraines     Past Surgical History:  Procedure Laterality Date  . RIGHT/LEFT HEART CATH AND CORONARY ANGIOGRAPHY N/A 12/02/2020   Procedure: RIGHT/LEFT HEART CATH AND CORONARY ANGIOGRAPHY;  Surgeon: Wellington Hampshire, MD;  Location: [facility];  Service: Cardiovascular;  Laterality: N/A;    Current Medications: Current Meds  Medication Sig  . albuterol (VENTOLIN HFA) 108 (90 Base) MCG/ACT inhaler Inhale 2 puffs into the lungs every 4 (four) hours as needed for wheezing or shortness of breath.  Marland Kitchen aspirin EC 81 MG EC tablet Take 1 tablet (81 mg total) by mouth daily. Swallow whole.  Marland Kitchen atorvastatin (LIPITOR) 40 MG tablet Take 1 tablet (40 mg total) by mouth daily.  . blood glucose meter kit and supplies KIT Dispense based on patient and insurance preference. Use up to four times daily as directed. (FOR ICD-9 250.00, 250.01).  . carvedilol (COREG) 3.125 MG tablet Take 1 tablet (3.125 mg total) by mouth 2 (two) times daily with a meal. (Patient taking differently: Take 3.125 mg by mouth daily at 8 pm.)  . lisinopril (ZESTRIL) 2.5 MG tablet Take 1  tablet (2.5 mg total) by mouth daily.  . promethazine (PHENERGAN) 12.5 MG tablet Take 1 tablet (12.5 mg total) by mouth daily as needed for nausea or vomiting.  . [DISCONTINUED] lisinopril (ZESTRIL) 10 MG tablet Take 1 tablet (10 mg total) by mouth daily.     Allergies:   Penicillins   Social History   Socioeconomic History  . Marital status: Single    Spouse name: Not on file  . Number of children: Not on file  . Years of education: Not on file  .  Highest education level: Not on file  Occupational History  . Not on file  Tobacco Use  . Smoking status: Former Smoker    Types: Cigarettes    Quit date: 05/16/1999    Years since quitting: 21.6  . Smokeless tobacco: Never Used  Substance and Sexual Activity  . Alcohol use: Yes    Comment: 2 bottles wine per month  . Drug use: Yes    Types: Marijuana  . Sexual activity: Not on file  Other Topics Concern  . Not on file  Social History Narrative  . Not on file   Social Determinants of Health   Financial Resource Strain: Not on file  Food Insecurity: Not on file  Transportation Needs: Not on file  Physical Activity: Not on file  Stress: Not on file  Social Connections: Not on file     Family History: The patient's family history is not on file.  ROS:   Please see the history of present illness.     All other systems reviewed and are negative.  EKGs/Labs/Other Studies Reviewed:    The following studies were reviewed today:  Echo 11/30/19 1. Left ventricular ejection fraction, by estimation, is 20 to 25%. The  left ventricle has severely decreased function. The left ventricle  demonstrates global hypokinesis. Left ventricular diastolic parameters are  indeterminate.  2. Right ventricular systolic function is normal. The right ventricular  size is normal. Tricuspid regurgitation signal is inadequate for assessing  PA pressure.  3. The mitral valve is normal in structure. No evidence of mitral valve  regurgitation. No evidence of mitral stenosis.  4. The aortic valve is normal in structure. Aortic valve regurgitation is  not visualized. No aortic stenosis is present.  5. The inferior vena cava is dilated in size with <50% respiratory  variability, suggesting right atrial pressure of 15 mmHg.   R/L heart cath 12/02/20  There is moderate to severe left ventricular systolic dysfunction.  LV end diastolic pressure is normal.  The left ventricular ejection fraction  is 25-35% by visual estimate.  There is mild (2+) mitral regurgitation.   1.  Near normal coronary arteries with no evidence of obstructive disease.  Superior takeoff of the left main coronary artery with slit-like appearance of the ostium.  However, this does not appear to be obstructive and doubt clinical significance of this. 2.  Moderate severely reduced LV systolic function with an EF of 30 to 35% with global hypokinesis. 3.  Right heart catheterization showed normal filling pressures, normal pulmonary pressure and normal cardiac output.  Recommendations: The patient seems to have nonischemic cardiomyopathy with some improvement in ejection fraction compared to her echocardiogram from 3 days ago.  Recommend medical therapy.  I added small dose carvedilol.  Continue lisinopril and consider adding spironolactone tomorrow.   EKG:  EKG is  ordered today.  The ekg ordered today demonstrates ST, 109 bpm, LAD, nonspecific T wave changes (not new)  Recent  Labs: 11/29/2020: B Natriuretic Peptide 755.9 12/02/2020: ALT 25; BUN 22; Creatinine, Ser 0.73; Magnesium 2.2; Potassium 3.7; Sodium 137 12/03/2020: Hemoglobin 16.5; Platelets 385  Recent Lipid Panel    Component Value Date/Time   CHOL 159 12/01/2020 0520   TRIG 72 12/02/2020 0624   HDL 73 12/01/2020 0520   CHOLHDL 2.2 12/01/2020 0520   VLDL 18 12/01/2020 0520   LDLCALC 68 12/01/2020 0520     Risk Assessment/Calculations:       Physical Exam:    VS:  BP 92/70 (BP Location: Left Arm, Patient Position: Sitting, Cuff Size: Normal)   Pulse (!) 109   Ht 5' 8"  (1.727 m)   Wt 188 lb 4 oz (85.4 kg)   SpO2 98%   BMI 28.62 kg/m     Wt Readings from Last 3 Encounters:  12/23/20 188 lb 4 oz (85.4 kg)  12/02/20 198 lb 10.2 oz (90.1 kg)  11/26/17 255 lb (115.7 kg)     GEN:  Well nourished, well developed in no acute distress HEENT: Normal NECK: No JVD; No carotid bruits LYMPHATICS: No lymphadenopathy CARDIAC: RR, tachycardic, no  murmurs, rubs, gallops RESPIRATORY:  Clear to auscultation without rales, wheezing or rhonchi  ABDOMEN: Soft, non-tender, non-distended MUSCULOSKELETAL:  No edema; No deformity  SKIN: Warm and dry NEUROLOGIC:  Alert and oriented x 3 PSYCHIATRIC:  Normal affect   ASSESSMENT:    1. Nonischemic cardiomyopathy ([place])   2. Chronic systolic heart failure ([place])   3. Vomiting and diarrhea   4. Pneumonia due to COVID-19 virus   5. Essential hypertension   6. Hyperlipidemia, mixed    PLAN:    In order of problems listed above:  HFrEF/NICM Newly found reduced EF 20-25% in the setting of respiratory failure and covid pna. LHC with no CAD. Echo, heart cath, and NICM was discussed in detail. In the hospital patient was started on coreg and lisinopril. Today she is likely dehydrated with recent vomiting/diarrhea. BP is soft, which is normal, but also tachycardic. She is only taking coreg once a day. Recommend coreg twice daily and I will decrease lisinopril to 2.40m daily. BMET today. Decreasing lisinopril also since there might be lab abnormalities with kidney insufficieny or electrolyte abnormality given recent severe vomiting/diarrhea. We will see patient back in 1 month. If BP is better can consider starting spiro at follow-up. Plan to re-check echo in 2-3 months to re-evaluate pump function.   Vomiting/Diarrhea Recent COVID infection/PNA Patient had N/V/D initially with COVID infection, however this has persisted post-hospitalization. She has lost 12 lbs. She has uncontrollable watery diarrhea and vomiting. No BRBPR or hematochezia but did have a very dark stool last night. Her breathing is better but she is still getting very sob and fatigued with even the slightest activity. Lungs are clear on exam today. BP is low and she is tachycardic which I suspect this is exacerbated with volume depletion. Patient has no PCP [facility] Patient is requesting something for nasuea so I will give  her a month supply of Phenergan and recommend she follow-up with PCP. It is likely GI symptoms are from covid but might need further GI testing if this continues. CBC and BMET today. I encouraged her to continue to eat and drink as able, and to not hesitate to go to the ER if this persists.   Susespected Myocarditis in the setting of COVID Demand ischemia Troponin severely elevated but no significant CAD on cath. Suspected demand ischemia in  the setting of reduced LVEF, elevated BP and possible myocarditis 2/2 covid. She was initially started on Aspirin. She denies chest pain or pressure. Since LHC had no significant CAD can consider discontinuation of Aspirin at follow-up.  HTN BP was elevated in the hospital started on coreg and lisinopril. BP today low at 92/70, which is normal for the patient. She was only taking coreg once a day as opposed to twice daily. She does not check BP at home. She does have some lightheadedness and dizziness, but this is also in the setting of suspsected dehydration from V/D. I will decrease lisinopril to 2.36m daily and recommend continue coreg twice daily. BMET today.   HLD LDL 68. Started on Atorvastatin 40 mg daily. No obstructive CAD. Can consider reduced dose at follow-up.   Disposition: Follow up in 1 month(s) with APP/MD   Shared Decision Making/Informed Consent       Signed, Romell Cavanah HNinfa Meeker PA-C  12/23/2020 4:23 PM    [facility]

## 2020-12-23 ENCOUNTER — Other Ambulatory Visit: Payer: Self-pay

## 2020-12-23 ENCOUNTER — Ambulatory Visit (INDEPENDENT_AMBULATORY_CARE_PROVIDER_SITE_OTHER): Payer: HRSA Program | Admitting: Medical

## 2020-12-23 ENCOUNTER — Encounter: Payer: Self-pay | Admitting: Physician Assistant

## 2020-12-23 VITALS — BP 92/70 | HR 109 | Ht 68.0 in | Wt 188.2 lb

## 2020-12-23 DIAGNOSIS — R1110 Vomiting, unspecified: Secondary | ICD-10-CM | POA: Diagnosis not present

## 2020-12-23 DIAGNOSIS — E782 Mixed hyperlipidemia: Secondary | ICD-10-CM

## 2020-12-23 DIAGNOSIS — I428 Other cardiomyopathies: Secondary | ICD-10-CM

## 2020-12-23 DIAGNOSIS — I400 Infective myocarditis: Secondary | ICD-10-CM

## 2020-12-23 DIAGNOSIS — I10 Essential (primary) hypertension: Secondary | ICD-10-CM

## 2020-12-23 DIAGNOSIS — J1282 Pneumonia due to coronavirus disease 2019: Secondary | ICD-10-CM

## 2020-12-23 DIAGNOSIS — I5022 Chronic systolic (congestive) heart failure: Secondary | ICD-10-CM

## 2020-12-23 DIAGNOSIS — U071 COVID-19: Secondary | ICD-10-CM | POA: Diagnosis not present

## 2020-12-23 DIAGNOSIS — R197 Diarrhea, unspecified: Secondary | ICD-10-CM

## 2020-12-23 MED ORDER — PROMETHAZINE HCL 12.5 MG PO TABS: 12.5000 mg | ORAL_TABLET | Freq: Every day | ORAL | 0 refills | Status: DC | PRN

## 2020-12-23 MED ORDER — LISINOPRIL 2.5 MG PO TABS: 2.5000 mg | ORAL_TABLET | Freq: Every day | ORAL | 3 refills | Status: DC

## 2020-12-23 NOTE — Patient Instructions (Addendum)
Medication Instructions:  Your physician has recommended you make the following change in your medication:   1. DECREASE Lisinopril to 2.5 mg once daily 2. AS NEEDED Phenergan 12.5 mg Once daily as needed for nausea and vomiting 3. INCREASE Carvedilol 3.125 mg twice daily   *If you need a refill on your cardiac medications before your next appointment, please call your pharmacy*   Lab Work: BMET & CBC today  If you have labs (blood work) drawn today and your tests are completely normal, you will receive your results only by: Marland Kitchen MyChart Message (if you have MyChart) OR . A paper copy in the mail If you have any lab test that is abnormal or we need to change your treatment, we will call you to review the results.   Testing/Procedures: None   Follow-Up: At [facility], you and your health needs are our priority.  As part of our continuing mission to provide you with exceptional heart care, we have created designated Provider Care Teams.  These Care Teams include your primary Cardiologist (physician) and Advanced Practice Providers (APPs -  Physician Assistants and Nurse Practitioners) who all work together to provide you with the care you need, when you need it.  We recommend signing up for the patient portal called "MyChart".  Sign up information is provided on this After Visit Summary.  MyChart is used to connect with patients for Virtual Visits (Telemedicine).  Patients are able to view lab/test results, encounter notes, upcoming appointments, etc.  Non-urgent messages can be sent to your provider as well.   To learn more about what you can do with MyChart, go to [email or web address].    Your next appointment:   1 month(s)  The format for your next appointment:   In Person  Provider:   Kathlyn Sacramento, MD or Christell Faith, PA-C    Call number [phone] to get assistance finding Primary Care Provider.

## 2020-12-24 LAB — CBC
Hematocrit: 44.3 % (ref 34.0–46.6)
Hemoglobin: 15.5 g/dL (ref 11.1–15.9)
MCH: 30.2 pg (ref 26.6–33.0)
MCHC: 35 g/dL (ref 31.5–35.7)
MCV: 86 fL (ref 79–97)
Platelets: 528 10*3/uL — ABNORMAL HIGH (ref 150–450)
RBC: 5.14 x10E6/uL (ref 3.77–5.28)
RDW: 12.3 % (ref 11.7–15.4)
WBC: 17.9 10*3/uL — ABNORMAL HIGH (ref 3.4–10.8)

## 2020-12-24 LAB — BASIC METABOLIC PANEL
BUN/Creatinine Ratio: 19 (ref 9–23)
BUN: 12 mg/dL (ref 6–24)
CO2: 20 mmol/L (ref 20–29)
Calcium: 9.8 mg/dL (ref 8.7–10.2)
Chloride: 99 mmol/L (ref 96–106)
Creatinine, Ser: 0.64 mg/dL (ref 0.57–1.00)
GFR calc Af Amer: 119 mL/min/{1.73_m2} (ref >59)
GFR calc non Af Amer: 103 mL/min/{1.73_m2} (ref >59)
Glucose: 313 mg/dL — ABNORMAL HIGH (ref 65–99)
Potassium: 4.2 mmol/L (ref 3.5–5.2)
Sodium: 137 mmol/L (ref 134–144)

## 2020-12-25 ENCOUNTER — Telehealth: Payer: Self-pay | Admitting: *Deleted

## 2020-12-25 NOTE — Telephone Encounter (Signed)
-----   Message from Thurman, PA-C sent at 12/25/2020 10:36 AM EST ----- Please call patient and inform her BMET showed elevated glucose. CBC showed elevated white count which normally is significant for active infection. She had been referred to [facility] for Primary care. Encourage her to continue with this. If she is having fever, chills, and continued vomiting and diarrhea patient might need to go the ER for further work-up, especially if she is unable to see a PCP sooner.

## 2020-12-25 NOTE — Telephone Encounter (Signed)
Attempted to call pt to review lab results and provider's recc. No answer. Unable to leave voicemail. Will try to reach pt at a later time.

## 2020-12-26 NOTE — Telephone Encounter (Signed)
Attempted to call pt. Unable to leave voicemail x 2.

## 2021-01-01 NOTE — Telephone Encounter (Signed)
Unable to reach pt on 3rd attempt, no voicemail to leave message. Letter mailed to pt asking to call our office to discuss lab results and provider's recc.

## 2021-01-13 NOTE — Telephone Encounter (Signed)
Patient returning call  Error with phone number changed

## 2021-01-13 NOTE — Telephone Encounter (Signed)
I spoke with the patient regarding her lab results. She advised she is feeling much better- no vomiting/ diarrhea/ fever.  I have confirmed with her she has the # for [facility], but has not established with a PCP yet. I did recommend multiple times during our conversation that she please call to establish care.  The patient was concerned about her elevated blood glucose level. She is not a confirmed diabetic. I advised her readings could be effected by her acute illness. She has a blood glucose meter listed on her medication list, but she confirms this was recommended for her at hospital discharge, but she does not have this in her possession.   I advised the patient that her glucose/ WBC readings may be some improved at this point as these are things that can change quickly, but she should follow up with a PCP for further management and monitoring.  The patient voices understanding and is agreeable.   She was appreciative for the call back.

## 2021-01-23 ENCOUNTER — Ambulatory Visit: Payer: Self-pay | Admitting: Cardiovascular Disease

## 2022-02-01 ENCOUNTER — Emergency Department: Payer: Self-pay

## 2022-02-01 ENCOUNTER — Inpatient Hospital Stay
Admission: EM | Admit: 2022-02-01 | Discharge: 2022-02-04 | DRG: 417 | Disposition: A | Discharge status: Home / Self Care | Payer: Self-pay | Attending: Internal Medicine | Admitting: Internal Medicine

## 2022-02-01 ENCOUNTER — Other Ambulatory Visit: Payer: Self-pay

## 2022-02-01 ENCOUNTER — Inpatient Hospital Stay: Payer: Self-pay

## 2022-02-01 ENCOUNTER — Encounter: Payer: Self-pay | Admitting: Radiology

## 2022-02-01 DIAGNOSIS — Z87442 Personal history of urinary calculi: Secondary | ICD-10-CM

## 2022-02-01 DIAGNOSIS — K8050 Calculus of bile duct without cholangitis or cholecystitis without obstruction: Secondary | ICD-10-CM | POA: Diagnosis present

## 2022-02-01 DIAGNOSIS — I10 Essential (primary) hypertension: Secondary | ICD-10-CM | POA: Diagnosis present

## 2022-02-01 DIAGNOSIS — E1165 Type 2 diabetes mellitus with hyperglycemia: Secondary | ICD-10-CM

## 2022-02-01 DIAGNOSIS — E872 Acidosis, unspecified: Secondary | ICD-10-CM | POA: Diagnosis present

## 2022-02-01 DIAGNOSIS — Z7982 Long term (current) use of aspirin: Secondary | ICD-10-CM

## 2022-02-01 DIAGNOSIS — E669 Obesity, unspecified: Secondary | ICD-10-CM | POA: Diagnosis present

## 2022-02-01 DIAGNOSIS — K8064 Calculus of gallbladder and bile duct with chronic cholecystitis without obstruction: Principal | ICD-10-CM | POA: Diagnosis present

## 2022-02-01 DIAGNOSIS — N179 Acute kidney failure, unspecified: Secondary | ICD-10-CM | POA: Diagnosis present

## 2022-02-01 DIAGNOSIS — Z87891 Personal history of nicotine dependence: Secondary | ICD-10-CM

## 2022-02-01 DIAGNOSIS — Z6830 Body mass index (BMI) 30.0-30.9, adult: Secondary | ICD-10-CM

## 2022-02-01 DIAGNOSIS — K8020 Calculus of gallbladder without cholecystitis without obstruction: Secondary | ICD-10-CM

## 2022-02-01 DIAGNOSIS — B3324 Viral cardiomyopathy: Secondary | ICD-10-CM | POA: Diagnosis present

## 2022-02-01 DIAGNOSIS — Z9114 Patient's other noncompliance with medication regimen: Secondary | ICD-10-CM

## 2022-02-01 DIAGNOSIS — N1330 Unspecified hydronephrosis: Secondary | ICD-10-CM

## 2022-02-01 DIAGNOSIS — Z880 Allergy status to penicillin: Secondary | ICD-10-CM

## 2022-02-01 DIAGNOSIS — K859 Acute pancreatitis without necrosis or infection, unspecified: Secondary | ICD-10-CM | POA: Diagnosis present

## 2022-02-01 DIAGNOSIS — U099 Post covid-19 condition, unspecified: Secondary | ICD-10-CM | POA: Diagnosis present

## 2022-02-01 DIAGNOSIS — I429 Cardiomyopathy, unspecified: Secondary | ICD-10-CM

## 2022-02-01 DIAGNOSIS — Z79899 Other long term (current) drug therapy: Secondary | ICD-10-CM

## 2022-02-01 DIAGNOSIS — R739 Hyperglycemia, unspecified: Secondary | ICD-10-CM

## 2022-02-01 DIAGNOSIS — K311 Adult hypertrophic pyloric stenosis: Secondary | ICD-10-CM | POA: Diagnosis present

## 2022-02-01 DIAGNOSIS — K760 Fatty (change of) liver, not elsewhere classified: Secondary | ICD-10-CM | POA: Diagnosis present

## 2022-02-01 DIAGNOSIS — R1011 Right upper quadrant pain: Principal | ICD-10-CM

## 2022-02-01 LAB — URINALYSIS, COMPLETE (UACMP) WITH MICROSCOPIC
Bacteria, UA: NONE SEEN
Bilirubin Urine: NEGATIVE
Glucose, UA: >=500 mg/dL — AB
Ketones, ur: NEGATIVE mg/dL
Leukocytes,Ua: NEGATIVE
Nitrite: NEGATIVE
Protein, ur: 30 mg/dL — AB
Specific Gravity, Urine: 1.025 (ref 1.005–1.030)
pH: 5 (ref 5.0–8.0)

## 2022-02-01 LAB — COMPREHENSIVE METABOLIC PANEL
ALT: 41 U/L (ref 0–44)
AST: 33 U/L (ref 15–41)
Albumin: 4.7 g/dL (ref 3.5–5.0)
Alkaline Phosphatase: 87 U/L (ref 38–126)
Anion gap: 18 — ABNORMAL HIGH (ref 5–15)
BUN: 70 mg/dL — ABNORMAL HIGH (ref 6–20)
CO2: 21 mmol/L — ABNORMAL LOW (ref 22–32)
Calcium: 10.1 mg/dL (ref 8.9–10.3)
Chloride: 82 mmol/L — ABNORMAL LOW (ref 98–111)
Creatinine, Ser: 1.91 mg/dL — ABNORMAL HIGH (ref 0.44–1.00)
GFR, Estimated: 31 mL/min — ABNORMAL LOW (ref >60)
Glucose, Bld: 613 mg/dL (ref 70–99)
Potassium: 3.5 mmol/L (ref 3.5–5.1)
Sodium: 121 mmol/L — ABNORMAL LOW (ref 135–145)
Total Bilirubin: 1.3 mg/dL — ABNORMAL HIGH (ref 0.3–1.2)
Total Protein: 9 g/dL — ABNORMAL HIGH (ref 6.5–8.1)

## 2022-02-01 LAB — PROTIME-INR
INR: 1.1 (ref 0.8–1.2)
Prothrombin Time: 14.1 seconds (ref 11.4–15.2)

## 2022-02-01 LAB — BLOOD GAS, VENOUS
Acid-Base Excess: 2.9 mmol/L — ABNORMAL HIGH (ref 0.0–2.0)
Bicarbonate: 27.9 mmol/L (ref 20.0–28.0)
O2 Saturation: 64.2 %
Patient temperature: 37
pCO2, Ven: 43 mmHg — ABNORMAL LOW (ref 44–60)
pH, Ven: 7.42 (ref 7.25–7.43)
pO2, Ven: 34 mmHg (ref 32–45)

## 2022-02-01 LAB — TYPE AND SCREEN
ABO/RH(D): A POS
Antibody Screen: NEGATIVE

## 2022-02-01 LAB — TROPONIN I (HIGH SENSITIVITY)
Troponin I (High Sensitivity): 42 ng/L — ABNORMAL HIGH (ref <18)
Troponin I (High Sensitivity): 36 ng/L — ABNORMAL HIGH (ref <18)

## 2022-02-01 LAB — CBG MONITORING, ED
Glucose-Capillary: 324 mg/dL — ABNORMAL HIGH (ref 70–99)
Glucose-Capillary: 145 mg/dL — ABNORMAL HIGH (ref 70–99)
Glucose-Capillary: 236 mg/dL — ABNORMAL HIGH (ref 70–99)

## 2022-02-01 LAB — CBC
HCT: 48.3 % — ABNORMAL HIGH (ref 36.0–46.0)
Hemoglobin: 17.6 g/dL — ABNORMAL HIGH (ref 12.0–15.0)
MCH: 28.9 pg (ref 26.0–34.0)
MCHC: 36.4 g/dL — ABNORMAL HIGH (ref 30.0–36.0)
MCV: 79.2 fL — ABNORMAL LOW (ref 80.0–100.0)
Platelets: 698 10*3/uL — ABNORMAL HIGH (ref 150–400)
RBC: 6.1 MIL/uL — ABNORMAL HIGH (ref 3.87–5.11)
RDW: 12.3 % (ref 11.5–15.5)
WBC: 19.1 10*3/uL — ABNORMAL HIGH (ref 4.0–10.5)
nRBC: 0 % (ref 0.0–0.2)

## 2022-02-01 LAB — LIPASE, BLOOD: Lipase: 76 U/L — ABNORMAL HIGH (ref 11–51)

## 2022-02-01 LAB — LACTIC ACID, PLASMA: Lactic Acid, Venous: 1.9 mmol/L (ref 0.5–1.9)

## 2022-02-01 MED ORDER — DEXTROSE 50 % IV SOLN: 0.0000 mL | INTRAVENOUS | Status: DC | PRN

## 2022-02-01 MED ORDER — INSULIN REGULAR(HUMAN) IN NACL 100-0.9 UT/100ML-% IV SOLN
INTRAVENOUS | Status: DC
  Administered 2022-02-01: 5 [IU]/h via INTRAVENOUS
  Filled 2022-02-01: qty 100

## 2022-02-01 MED ORDER — ONDANSETRON HCL 4 MG PO TABS: 4.0000 mg | ORAL_TABLET | Freq: Four times a day (QID) | ORAL | Status: DC | PRN

## 2022-02-01 MED ORDER — DEXTROSE IN LACTATED RINGERS 5 % IV SOLN: INTRAVENOUS | Status: DC

## 2022-02-01 MED ORDER — ONDANSETRON HCL 4 MG/2ML IJ SOLN
4.0000 mg | Freq: Once | INTRAMUSCULAR | Status: AC
  Administered 2022-02-01: 4 mg via INTRAVENOUS
  Filled 2022-02-01: qty 2

## 2022-02-01 MED ORDER — ACETAMINOPHEN 325 MG PO TABS
650.0000 mg | ORAL_TABLET | Freq: Four times a day (QID) | ORAL | Status: DC | PRN
  Administered 2022-02-04: 650 mg via ORAL
  Filled 2022-02-01: qty 2

## 2022-02-01 MED ORDER — CARVEDILOL 12.5 MG PO TABS
12.5000 mg | ORAL_TABLET | Freq: Two times a day (BID) | ORAL | Status: DC
  Administered 2022-02-02 – 2022-02-04 (×5): 12.5 mg via ORAL
  Filled 2022-02-01 (×3): qty 1
  Filled 2022-02-01: qty 2
  Filled 2022-02-01: qty 1

## 2022-02-01 MED ORDER — BISACODYL 10 MG RE SUPP
10.0000 mg | Freq: Every day | RECTAL | Status: DC | PRN
  Filled 2022-02-01: qty 1

## 2022-02-01 MED ORDER — INSULIN ASPART 100 UNIT/ML IJ SOLN
10.0000 [IU] | Freq: Once | INTRAMUSCULAR | Status: AC
  Administered 2022-02-01: 10 [IU] via INTRAVENOUS
  Filled 2022-02-01: qty 1

## 2022-02-01 MED ORDER — LACTATED RINGERS IV SOLN: INTRAVENOUS | Status: DC

## 2022-02-01 MED ORDER — ENOXAPARIN SODIUM 60 MG/0.6ML IJ SOSY: 0.5000 mg/kg | PREFILLED_SYRINGE | INTRAMUSCULAR | Status: DC

## 2022-02-01 MED ORDER — SODIUM CHLORIDE 0.9 % IV SOLN
2.0000 g | INTRAVENOUS | Status: DC
  Filled 2022-02-01 (×2): qty 20

## 2022-02-01 MED ORDER — METRONIDAZOLE 500 MG/100ML IV SOLN
500.0000 mg | Freq: Three times a day (TID) | INTRAVENOUS | Status: DC
  Administered 2022-02-01 – 2022-02-03 (×6): 500 mg via INTRAVENOUS
  Filled 2022-02-01 (×7): qty 100

## 2022-02-01 MED ORDER — SODIUM CHLORIDE 0.9 % IV BOLUS
500.0000 mL | Freq: Once | INTRAVENOUS | Status: AC
  Administered 2022-02-01: 500 mL via INTRAVENOUS

## 2022-02-01 MED ORDER — ONDANSETRON HCL 4 MG/2ML IJ SOLN
4.0000 mg | Freq: Four times a day (QID) | INTRAMUSCULAR | Status: DC | PRN
Start: 2022-02-01 — End: 2022-02-04
  Administered 2022-02-01 – 2022-02-02 (×3): 4 mg via INTRAVENOUS
  Filled 2022-02-01 (×3): qty 2

## 2022-02-01 MED ORDER — CEFTRIAXONE SODIUM 1 G IJ SOLR: 1.0000 g | Freq: Once | INTRAMUSCULAR | Status: DC

## 2022-02-01 MED ORDER — MORPHINE SULFATE (PF) 2 MG/ML IV SOLN
2.0000 mg | INTRAVENOUS | Status: DC | PRN
  Administered 2022-02-01 – 2022-02-03 (×7): 2 mg via INTRAVENOUS
  Filled 2022-02-01 (×9): qty 1

## 2022-02-01 MED ORDER — PANTOPRAZOLE SODIUM 40 MG IV SOLR
40.0000 mg | Freq: Once | INTRAVENOUS | Status: AC
Start: 2022-02-01 — End: 2022-02-01
  Administered 2022-02-01: 40 mg via INTRAVENOUS
  Filled 2022-02-01: qty 10

## 2022-02-01 MED ORDER — MORPHINE SULFATE (PF) 4 MG/ML IV SOLN
4.0000 mg | INTRAVENOUS | Status: DC | PRN
  Administered 2022-02-01: 4 mg via INTRAVENOUS
  Filled 2022-02-01: qty 1

## 2022-02-01 MED ORDER — IOHEXOL 300 MG/ML  SOLN
65.0000 mL | Freq: Once | INTRAMUSCULAR | Status: AC | PRN
Start: 2022-02-01 — End: 2022-02-01
  Administered 2022-02-01: 65 mL via INTRAVENOUS

## 2022-02-01 MED ORDER — ACETAMINOPHEN 650 MG RE SUPP: 650.0000 mg | Freq: Four times a day (QID) | RECTAL | Status: DC | PRN

## 2022-02-01 MED ORDER — SODIUM CHLORIDE 0.9 % IV SOLN
2.0000 g | Freq: Once | INTRAVENOUS | Status: AC
  Administered 2022-02-01: 2 g via INTRAVENOUS
  Filled 2022-02-01: qty 20

## 2022-02-01 MED ORDER — SODIUM CHLORIDE 0.9 % IV BOLUS
1000.0000 mL | Freq: Once | INTRAVENOUS | Status: AC
  Administered 2022-02-01: 1000 mL via INTRAVENOUS

## 2022-02-01 MED ORDER — INSULIN REGULAR(HUMAN) IN NACL 100-0.9 UT/100ML-% IV SOLN: INTRAVENOUS | Status: DC

## 2022-02-01 MED ORDER — HYDROCODONE-ACETAMINOPHEN 5-325 MG PO TABS
1.0000 | ORAL_TABLET | ORAL | Status: DC | PRN
  Administered 2022-02-02: 1 via ORAL
  Administered 2022-02-03 (×2): 2 via ORAL
  Filled 2022-02-01: qty 2
  Filled 2022-02-01: qty 1
  Filled 2022-02-01: qty 2

## 2022-02-01 NOTE — ED Provider Notes (Signed)
? ?[facility] ?Provider Note ? ? ? Event Date/Time  ? First MD Initiated Contact with Patient 02/01/22 [phone]   ?  (approximate) ? ? ?History  ? ?Abdominal Pain ? ? ?HPI ? ?Krystal Gregory is a 51 y.o. female not any blood thinners presents to the ER for 3 days of progressively worsening epigastric and right upper quadrant pain associated nausea vomiting.  States that she is vomiting coffee-ground emesis.  Has not had any bowel movements in 3 days.  No previous abdominal surgeries.  States the pain is moderate to severe nonradiating.  No recent antibiotics.  Denies any traumatic injury. ?  ? ? ?Physical Exam  ? ?Triage Vital Signs: ?ED Triage Vitals  ?Enc Vitals Group  ?   BP   ?   Pulse   ?   Resp   ?   Temp   ?   Temp src   ?   SpO2   ?   Weight   ?   Height   ?   Head Circumference   ?   Peak Flow   ?   Pain Score   ?   Pain Loc   ?   Pain Edu?   ?   Excl. in [place]?   ? ? ?Most recent vital signs: ?Vitals:  ? 02/01/22 0838 02/01/22 0840  ?BP:  (!) 181/131  ?Pulse: (!) 106   ?Resp: 18   ?Temp: 98.3 ?F (36.8 ?C)   ?SpO2: 96%   ? ? ? ?Constitutional: Alert uncomfortable appearing ?Eyes: Conjunctivae are normal.  ?Head: Atraumatic. ?Nose: No congestion/rhinnorhea. ?Mouth/Throat: Mucous membranes are moist.   ?Neck: Painless ROM.  ?Cardiovascular:   Good peripheral circulation. ?Respiratory: Normal respiratory effort.  No retractions.  ?Gastrointestinal: Soft with ruq ttp, no guarding or rebound ?Musculoskeletal:  no deformity ?Neurologic:  MAE spontaneously. No gross focal neurologic deficits are appreciated.  ?Skin:  Skin is warm, dry and intact. No rash noted. ?Psychiatric: Mood and affect are normal. Speech and behavior are normal. ? ? ? ?ED Results / Procedures / Treatments  ? ?Labs ?(all labs ordered are listed, but only abnormal results are displayed) ?Labs Reviewed  ?CBC - Abnormal; Notable for the following components:  ?    Result Value  ? WBC 19.1 (*)   ? RBC 6.10 (*)   ? Hemoglobin 17.6  (*)   ? HCT 48.3 (*)   ? MCV 79.2 (*)   ? MCHC 36.4 (*)   ? Platelets 698 (*)   ? All other components within normal limits  ?COMPREHENSIVE METABOLIC PANEL - Abnormal; Notable for the following components:  ? Sodium 121 (*)   ? Chloride 82 (*)   ? CO2 21 (*)   ? Glucose, Bld 613 (*)   ? BUN 70 (*)   ? Creatinine, Ser 1.91 (*)   ? Total Protein 9.0 (*)   ? Total Bilirubin 1.3 (*)   ? GFR, Estimated 31 (*)   ? Anion gap 18 (*)   ? All other components within normal limits  ?LIPASE, BLOOD - Abnormal; Notable for the following components:  ? Lipase 76 (*)   ? All other components within normal limits  ?URINALYSIS, COMPLETE (UACMP) WITH MICROSCOPIC - Abnormal; Notable for the following components:  ? Color, Urine YELLOW (*)   ? APPearance HAZY (*)   ? Glucose, UA >=500 (*)   ? Hgb urine dipstick MODERATE (*)   ? Protein, ur 30 (*)   ?  All other components within normal limits  ?BLOOD GAS, VENOUS - Abnormal; Notable for the following components:  ? pCO2, Ven 43 (*)   ? Acid-Base Excess 2.9 (*)   ? All other components within normal limits  ?TROPONIN I (HIGH SENSITIVITY) - Abnormal; Notable for the following components:  ? Troponin I (High Sensitivity) 42 (*)   ? All other components within normal limits  ?CULTURE, BLOOD (ROUTINE X 2)  ?CULTURE, BLOOD (ROUTINE X 2)  ?PROTIME-INR  ?URINALYSIS, ROUTINE W REFLEX MICROSCOPIC  ?LACTIC ACID, PLASMA  ?POC URINE PREG, ED  ?CBG MONITORING, ED  ?TYPE AND SCREEN  ?TROPONIN I (HIGH SENSITIVITY)  ? ? ? ?EKG ? ?ED ECG REPORT ?I, Merlyn Lot, the attending physician, personally viewed and interpreted this ECG. ? ? Date: 02/01/2022 ? EKG Time: 8:42 ? Rate: 110 ? Rhythm: sinus ? Axis: normal ? Intervals: normal qt ? ST&T Change: occasional pvc, no stemi,  ? ? ? ?RADIOLOGY ?Please see ED Course for my review and interpretation. ? ?I personally reviewed all radiographic images ordered to evaluate for the above acute complaints and reviewed radiology reports and findings.  These findings  were personally discussed with the patient.  Please see medical record for radiology report. ? ? ? ?PROCEDURES: ? ?Critical Care performed: Yes, see critical care procedure note(s) ? ?.Critical Care ?Performed by: Merlyn Lot, MD ?Authorized by: Merlyn Lot, MD  ? ?Critical care provider statement:  ?  Critical care time (minutes):  36 ?  Critical care was necessary to treat or prevent imminent or life-threatening deterioration of the following conditions:  Endocrine crisis and metabolic crisis ?  Critical care was time spent personally by me on the following activities:  Ordering and performing treatments and interventions, ordering and review of laboratory studies, ordering and review of radiographic studies, pulse oximetry, re-evaluation of patient's condition, review of old charts, obtaining history from patient or surrogate, examination of patient, evaluation of patient's response to treatment, discussions with primary provider, discussions with consultants and development of treatment plan with patient or surrogate ? ? ?MEDICATIONS ORDERED IN ED: ?Medications  ?morphine (PF) 4 MG/ML injection 4 mg (4 mg Intravenous Given 02/01/22 0911)  ?insulin regular, human (MYXREDLIN) 100 units/ 100 mL infusion (has no administration in time range)  ?lactated ringers infusion (has no administration in time range)  ?dextrose 5 % in lactated ringers infusion (has no administration in time range)  ?dextrose 50 % solution 0-50 mL (has no administration in time range)  ?cefTRIAXone (ROCEPHIN) 1 g in sodium chloride 0.9 % 100 mL IVPB (has no administration in time range)  ?ondansetron [facility]) injection 4 mg (4 mg Intravenous Given 02/01/22 0911)  ?sodium chloride 0.9 % bolus 500 mL (0 mLs Intravenous Stopped 02/01/22 1126)  ?sodium chloride 0.9 % bolus 1,000 mL (0 mLs Intravenous Stopped 02/01/22 1126)  ?insulin aspart (novoLOG) injection 10 Units (10 Units Intravenous Given 02/01/22 1003)  ?pantoprazole (PROTONIX)  injection 40 mg (40 mg Intravenous Given 02/01/22 1021)  ?iohexol (OMNIPAQUE) 300 MG/ML solution 65 mL (65 mLs Intravenous Contrast Given 02/01/22 1059)  ? ? ? ?IMPRESSION / MDM / ASSESSMENT AND PLAN / ED COURSE  ?I reviewed the triage vital signs and the nursing notes. ?             ?               ? ?Differential diagnosis includes, but is not limited to, gastritis, pud, cholelithiasis, cholecystitis, pancreatitis, stone, pyelo, sbo, perforation ? ?Patient very  uncomfortable appearing presenting with symptoms as described above tachycardic hypertensive protecting her airway no hypoxia.  CT imaging as well as blood work IV fluids IV morphine ordered for the above differential. ? ?Clinical Course as of 02/01/22 1223  ?Sun Feb 01, 2022  ?0920 Chest x-ray by my review and interpretation shows no pneumothorax consolidation or infiltrate.  Patient with noted leukocytosis [PR]  ?0948 Patient with critically elevated glucose of 600.  We will add on VBG and UA due to concern for possible DKA or HHS. [PR]  ?0955 Patient with high anion gap mild metabolic acidosis.  Lipase is mildly elevated and CT imaging ordered.  We will continue additional IV fluids. [PR]  ?1109 My review and interpretation of CT abdomen pelvis she does have cholelithiasis.  No perforation. [PR]  ?1211 CT imaging with cholelithiasis and dilated common bile duct.  I discussed case in consultation with Dr. Haig Prophet of GI who does agree with plan for MRCP.  Given her hyperglycemia will be placed on insulin infusion.  Will give dose of Rocephin.  Will discuss with hospitalist for admission. [PR]  ?  ?Clinical Course User Index ?[PR] Merlyn Lot, MD  ? ? ? ?FINAL CLINICAL IMPRESSION(S) / ED DIAGNOSES  ? ?Final diagnoses:  ?RUQ pain  ?Hyperglycemia  ?Calculus of gallbladder without cholecystitis without obstruction  ? ? ? ?Rx / DC Orders  ? ?ED Discharge Orders   ? ? None  ? ?  ? ? ? ?Note:  This document was prepared using Dragon voice recognition  software and may include unintentional dictation errors. ? ?  ?Merlyn Lot, MD ?02/01/22 1223 ? ?

## 2022-02-01 NOTE — H&P (Addendum)
?History and Physical  ? ? ?PatientSHIVONNE Gregory EVO:[id number] DOB: 08-18-1971 ?DOA: 02/01/2022 ?DOS: the patient was seen and examined on 02/01/2022 ?PCP: Vania Rea, MD  ?Patient coming from: Home ? ?Chief Complaint:  ?Chief Complaint  ?Patient presents with  ? Abdominal Pain  ? ?HPI: Krystal Gregory is a 51 y.o. female with medical history significant for nonischemic cardiomyopathy, likely secondary to Covid 19, hyperglycemia with BS 619 in setting of steroids use was in [facility] till 2-3 days ago when she developed onset of N/V and RUQ pain. No fevers or chills. She came in today due to inability to take po without vomiting.  ? ?Patient states that she has had renal stones in the past which usually presents as right lower quadrant pain but she notes "I jiggle it around and they pass".  She notes this right upper quadrant/epigastric pain is not improving as she would assume.  She notes she is unable to take p.o.'s at all without vomiting.  She thinks she may have had coffee grounds in one of her vomitus but is not really sure.  No red vomitus.  No diarrhea.  Patient denies alcohol use.  Denies any medications that are new. ? ?ROS is notable for significant weight loss of 50 pounds over the past year.  Patient states she has been try to lose weight because she wanted to help her heart.  She does admit to polyuria and extreme thirst for a while.  Patient thinks she may have had fevers and chills over the past 2 to 3 days but is not sure, does admit that she has had some sweating.  Patient denies any orthopnea, PND or DOE however does admit that her exercise tolerance is not very good anyway due to her weight and inactivity. ? ?In the ED patient was noted to be markedly hyperglycemic with blood sugar of 619, no real evidence of DKA with negative ketones, bicarb 21 and AG of 18 CT abdomen showed slight dilation of CBD but no evidence of cholecystitis.  She was noted to have a lipase of 76.  Patient was discussed with  GI who recommended MRCP which was ordered.  Patient was started on insulin drip and given 1 dose of ceftriaxone pending admission. ? ?  ?Review of Systems: As mentioned in the history of present illness. All other systems reviewed and are negative. ?Past Medical History:  ?Diagnosis Date  ? Cardiomyopathy ([place])   ? a. 11/2020 Echo: EF 20-25%, no rwma, glob HK, nl RV size/fxn.  ? COVID-19 virus infection 11/2020  ? Migraines   ? ?Past Surgical History:  ?Procedure Laterality Date  ? RIGHT/LEFT HEART CATH AND CORONARY ANGIOGRAPHY N/A 12/02/2020  ? Procedure: RIGHT/LEFT HEART CATH AND CORONARY ANGIOGRAPHY;  Surgeon: Wellington Hampshire, MD;  Location: [facility];  Service: Cardiovascular;  Laterality: N/A;  ? ?Social History:  reports that she quit smoking about 22 years ago. Her smoking use included cigarettes. She has never used smokeless tobacco. She reports current alcohol use. She reports current drug use. Drug: Marijuana. ? ?Allergies  ?Allergen Reactions  ? Penicillins Rash  ? ? ?History reviewed. No pertinent family history. ? ?Prior to Admission medications   ?Medication Sig Start Date End Date Taking? Authorizing Provider  ?albuterol (VENTOLIN HFA) 108 (90 Base) MCG/ACT inhaler Inhale 2 puffs into the lungs every 4 (four) hours as needed for wheezing or shortness of breath. 12/03/20   Fritzi Mandes, MD  ?aspirin EC 81 MG EC tablet  Take 1 tablet (81 mg total) by mouth daily. Swallow whole. 12/04/20   Fritzi Mandes, MD  ?atorvastatin (LIPITOR) 40 MG tablet Take 1 tablet (40 mg total) by mouth daily. 12/04/20   Fritzi Mandes, MD  ?blood glucose meter kit and supplies KIT Dispense based on patient and insurance preference. Use up to four times daily as directed. (FOR ICD-9 250.00, 250.01). 12/03/20   Fritzi Mandes, MD  ?carvedilol (COREG) 3.125 MG tablet Take 1 tablet (3.125 mg total) by mouth 2 (two) times daily with a meal. ?Patient taking differently: Take 3.125 mg by mouth daily at 8 pm. 12/03/20   Fritzi Mandes, MD   ?lisinopril (ZESTRIL) 2.5 MG tablet Take 1 tablet (2.5 mg total) by mouth daily. 12/23/20 03/23/21  Furth, Cadence H, PA-C  ?promethazine (PHENERGAN) 12.5 MG tablet Take 1 tablet (12.5 mg total) by mouth daily as needed for nausea or vomiting. 12/23/20   Furth, Cadence H, PA-C  ? ? ?Physical Exam: ?Vitals:  ? 02/01/22 0838 02/01/22 0839 02/01/22 0840  ?BP:   (!) 181/131  ?Pulse: (!) 106    ?Resp: 18    ?Temp: 98.3 ?F (36.8 ?C)    ?TempSrc: Oral    ?SpO2: 96%    ?Weight:  90.7 kg   ?Height:  _0  (1.727 m)   ? ?Physical Exam: ?Blood pressure (!) 181/131, pulse (!) 106, temperature 98.3 ?F (36.8 ?C), temperature source Oral, resp. rate 18, height _1  (1.727 m), weight 90.7 kg, SpO2 96 %. ?Gen: Ruddy complected female lying in bed in NAD. ?Eyes: sclera anicteric, conjuctiva mildly injected bilaterally ?CVS: S1-S2, regulary, no gallops ?Respiratory:  decreased air entry likely secondary to decreased inspiratory effort ?GI: NABS, entirely soft, no tenderness to light or deep palpation in epigastric or RUQ areas, Percell Miller sign is negative ?LE: No edema. No cyanosis ?Neuro: A/O x 3, Moving all extremities equally with normal strength, CN 3-12 intact, grossly nonfocal.  ?Psych: patient is logical and coherent, judgement and insight appear normal, mood and affect appropriate to situation. ?Skin: no rashes or lesions or ulcers,  ? ?Data Reviewed: ? ? ?CT shows cholelithiasis and mild dilatation of the CBD similar to previous study.  She is also noted to have mild left hydro ureteronephrosis possibly secondary to a ureteral stent stricture/stenosis ? ?Assessment and Plan: ?No notes have been filed under this hospital service. ?Service: Hospitalist ? ? ?Hyperglycemia with new onset DM ?Patient denies h/o DM, but she was noted to have hyperglycemia here in January 2022 which was attributed to steroid use.  ?She has lost 50 lbs, very possibly from glycosuria  ?Patient has been started on insulin drip in ED which I will continue, this  can be titrated to off per protocol, I do not think she is in DKA. ?Will need to initiate antidiabetic meds on discharge ? ?RUQ pain/N/V and mild pancreatitis ?I suspect patient's symptomatology of RUQ/epigastric pain and nausea vomiting is secondary to her mild pancreatitis with lipase 76.  However I do not have an etiology for pancreatitis except her slightly dilated CBD. Although there is no evidence of cholecystitis/stranding on CT, given subjective fever, leukocytosis, agree with continuing empiric antibiotics as initiated in the ED. ?MRCP is ordered and pending per ED discussion with GI--MRCP shows retained stone, Dr Haig Prophet is planning ERCP in AM. Patient will need General Surgery consult for cholecystectomy after that. Patient is made NPO p MN. Will treat pancreatitis conservatively with bowel rest and fluid resuscitation/pain management. ? 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 ?Will hydrate and recheck, unclear what patient's baseline is. ?Patient states she has history of kidney stones in the past, will benefit from referral to urology as an outpatient. ? ?Ischemic cardiomyopathy ?Patient without symptoms of heart failure over past 2 years, has not been taking any heart failure medicines. ?Will order Echocardiogram to assess cardiac function. ? ?HTN ?Patient has not been taking her carvedilol and lisinopril as previously ordered ?Will start patient on carvedilol 12.5 p.o. twice daily ?Hold lisinopril given abnormal creatinine of unknown duration ?Follow BP and manage HTN as per results of echo ? ? ? Advance Care Planning:   Code Status: Prior full code ? ?Consults: None ? ?Family Communication: Communicated with patient, she states she will communicate with her family. ? ?Severity of Illness: ?The appropriate patient status for this patient is INPATIENT.  Inpatient status is judged to be reasonable and necessary in order to provide the required intensity of service to ensure the patient's safety. The patient's presenting symptoms, physical exam findings,

## 2022-02-01 NOTE — ED Notes (Signed)
MRI called this RN for screening questions. Phone given to pt. ?

## 2022-02-01 NOTE — ED Notes (Signed)
Pt asking for something to eat. Secure msg sent to Dr. Jeralene Peters about diet orders, given that pt just started on insulin drip. ?

## 2022-02-01 NOTE — ED Triage Notes (Signed)
Pt states she has been here in town for the past three days and having on and off coffee ground emesis for those three days. Pt complaining of epigastric pain that spans from side to side. Pt also states her stomach is swelling. ?

## 2022-02-01 NOTE — ED Notes (Signed)
Pt reporting pain & nausea; states the alarms are making this worse. ?

## 2022-02-01 NOTE — Consult Note (Signed)
Consultation ? ?Referring Provider:   ED   ?Admit date: 02/01/2022 ?Consult date: 02/01/2022         ?Reason for Consultation:   Choledocholithiasis   ?       ? HPI:   ?Krystal Gregory is a 51 y.o. lady with history of non-ischemic cardiomyopathy last year which was attributed to [place] who presents with three day history of nausea/vomiting and RUQ pain. In the ED her liver enzymes were normal except for slightly elevated T. Bili at 1.3 but CT showed slightly dilated CBD to 7 mm. An MRCP was performed which showed a 0.6 cm gallstone in the CBD close to the ampulla. She notes fevers at home but feels better after IV fluids and antibiotics. No fever here and she has hypertension. Of note she was very hyperglycemic which is currently being corrected. ? ? ?Past Medical History:  ?Diagnosis Date  ? Cardiomyopathy ([place])   ? a. 11/2020 Echo: EF 20-25%, no rwma, glob HK, nl RV size/fxn.  ? COVID-19 virus infection 11/2020  ? Migraines   ? ? ?Past Surgical History:  ?Procedure Laterality Date  ? RIGHT/LEFT HEART CATH AND CORONARY ANGIOGRAPHY N/A 12/02/2020  ? Procedure: RIGHT/LEFT HEART CATH AND CORONARY ANGIOGRAPHY;  Surgeon: Wellington Hampshire, MD;  Location: [facility];  Service: Cardiovascular;  Laterality: N/A;  ? ? ?History reviewed. No pertinent family history. No family history of any significant GI issues ? ?Social History  ? ?Tobacco Use  ? Smoking status: Former  ?  Types: Cigarettes  ?  Quit date: 05/16/1999  ?  Years since quitting: 22.7  ? Smokeless tobacco: Never  ?Substance Use Topics  ? Alcohol use: Yes  ?  Comment: 2 bottles wine per month  ? Drug use: Yes  ?  Types: Marijuana  ? ? ?Prior to Admission medications   ?Medication Sig Start Date End Date Taking? Authorizing Provider  ?albuterol (VENTOLIN HFA) 108 (90 Base) MCG/ACT inhaler Inhale 2 puffs into the lungs every 4 (four) hours as needed for wheezing or shortness of breath. 12/03/20   Fritzi Mandes, MD  ?aspirin EC 81 MG EC tablet Take 1 tablet (81  mg total) by mouth daily. Swallow whole. 12/04/20   Fritzi Mandes, MD  ?atorvastatin (LIPITOR) 40 MG tablet Take 1 tablet (40 mg total) by mouth daily. 12/04/20   Fritzi Mandes, MD  ?blood glucose meter kit and supplies KIT Dispense based on patient and insurance preference. Use up to four times daily as directed. (FOR ICD-9 250.00, 250.01). 12/03/20   Fritzi Mandes, MD  ?carvedilol (COREG) 3.125 MG tablet Take 1 tablet (3.125 mg total) by mouth 2 (two) times daily with a meal. ?Patient taking differently: Take 3.125 mg by mouth daily at 8 pm. 12/03/20   Fritzi Mandes, MD  ?lisinopril (ZESTRIL) 2.5 MG tablet Take 1 tablet (2.5 mg total) by mouth daily. 12/23/20 03/23/21  Furth, Cadence H, PA-C  ?promethazine (PHENERGAN) 12.5 MG tablet Take 1 tablet (12.5 mg total) by mouth daily as needed for nausea or vomiting. 12/23/20   Furth, Cadence H, PA-C  ? ? ?Current Facility-Administered Medications  ?Medication Dose Route Frequency Provider Last Rate Last Admin  ? acetaminophen (TYLENOL) tablet 650 mg  650 mg Oral Q6H PRN Vashti Hey, MD      ? Or  ? acetaminophen (TYLENOL) suppository 650 mg  650 mg Rectal Q6H PRN Vashti Hey, MD      ? bisacodyl (DULCOLAX) suppository 10 mg  10 mg  Rectal Daily PRN Vashti Hey, MD      ? carvedilol (COREG) tablet 12.5 mg  12.5 mg Oral BID WC Vashti Hey, MD      ? Derrill Memo ON 02/02/2022] cefTRIAXone (ROCEPHIN) 2 g in sodium chloride 0.9 % 100 mL IVPB  2 g Intravenous Q24H Bonnell Public Tublu, MD      ? dextrose 5 % in lactated ringers infusion   Intravenous Continuous Vashti Hey, MD   Held at 02/01/22 1600  ? dextrose 50 % solution 0-50 mL  0-50 mL Intravenous PRN Vashti Hey, MD      ? HYDROcodone-acetaminophen (NORCO/VICODIN) 5-325 MG per tablet 1-2 tablet  1-2 tablet Oral Q4H PRN Vashti Hey, MD      ? insulin regular, human (MYXREDLIN) 100 units/ 100 mL infusion   Intravenous Continuous Bonnell Public Tublu, MD 5 mL/hr at 02/01/22 1514 5 Units/hr at 02/01/22 1514  ? lactated ringers infusion   Intravenous Continuous Bonnell Public Tublu, MD      ? morphine (PF) 2 MG/ML injection 2 mg  2 mg Intravenous Q4H PRN Vashti Hey, MD      ? ondansetron [facility]) tablet 4 mg  4 mg Oral Q6H PRN Vashti Hey, MD      ? Or  ? ondansetron (ZOFRAN) injection 4 mg  4 mg Intravenous Q6H PRN Vashti Hey, MD      ? ?Current Outpatient Medications  ?Medication Sig Dispense Refill  ? albuterol (VENTOLIN HFA) 108 (90 Base) MCG/ACT inhaler Inhale 2 puffs into the lungs every 4 (four) hours as needed for wheezing or shortness of breath. 1 g 0  ? aspirin EC 81 MG EC tablet Take 1 tablet (81 mg total) by mouth daily. Swallow whole. 30 tablet 11  ? atorvastatin (LIPITOR) 40 MG tablet Take 1 tablet (40 mg total) by mouth daily. 30 tablet 3  ? blood glucose meter kit and supplies KIT Dispense based on patient and insurance preference. Use up to four times daily as directed. (FOR ICD-9 250.00, 250.01). 1 each 0  ? carvedilol (COREG) 3.125 MG tablet Take 1 tablet (3.125 mg total) by mouth 2 (two) times daily with a meal. (Patient taking differently: Take 3.125 mg by mouth daily at 8 pm.) 60 tablet 3  ? lisinopril (ZESTRIL) 2.5 MG tablet Take 1 tablet (2.5 mg total) by mouth daily. 90 tablet 3  ? promethazine (PHENERGAN) 12.5 MG tablet Take 1 tablet (12.5 mg total) by mouth daily as needed for nausea or vomiting. 30 tablet 0  ? ? ?Allergies as of 02/01/2022 - Review Complete 02/01/2022  ?Allergen Reaction Noted  ? Penicillins Rash 11/26/2017  ? ? ? ?Review of Systems:    ?All systems reviewed and negative except where noted in HPI. ? ?Review of Systems  ?Constitutional:  Positive for chills and fever.  ?Respiratory:  Negative for shortness of breath.   ?Cardiovascular:  Negative for chest pain.  ?Gastrointestinal:  Positive for abdominal pain, nausea and vomiting. Negative for blood in stool  and melena.  ?Genitourinary:  Negative for dysuria.  ?Musculoskeletal:  Positive for joint pain.  ?Skin:  Negative for rash.  ?Neurological:  Negative for focal weakness.  ?Endo/Heme/Allergies:  Positive for polydipsia.  ?Psychiatric/Behavioral:  Negative for substance abuse.   ?All other systems reviewed and are negative. ? ? ? ? Physical Exam:  ?Vital signs in last 24 hours: ?Temp:  [98.3 ?F (36.8 ?C)] 98.3 ?F (36.8 ?C) (03/19 1610) ?Pulse Rate:  [  79-106] 79 (03/19 1500) ?Resp:  [18] 18 (03/19 9223) ?BP: (130-181)/(94-131) 139/100 (03/19 1500) ?SpO2:  [95 %-96 %] 95 % (03/19 1500) ?Weight:  [90.7 kg] 90.7 kg (03/19 0839) ?  ?General:   Pleasant  in NAD ?Head:  Normocephalic and atraumatic. ?Eyes:   No icterus.   Conjunctiva pink. ?Mouth: Mucosa pink moist, no lesions. ?Neck:  Supple; no masses felt ?Lungs:  No respiratory distress ?Heart:  slightly tachycardic ?Abdomen:   Flat, soft, nondistended, nontender ?Rectal:  Not performed.  ?Msk:  MAEW x4, No clubbing or cyanosis. Strength 5/5. Symmetrical without gross deformities. ?Neurologic:  Alert and  oriented x4;  Cranial nerves II-XII intact.  ?Skin:  Warm, dry, pink without significant lesions or rashes. ?Psych:  Alert and cooperative. Normal affect. ? ?LAB RESULTS: ?Recent Labs  ?  02/01/22 ?0849  ?WBC 19.1*  ?HGB 17.6*  ?HCT 48.3*  ?PLT 698*  ? ?BMET ?Recent Labs  ?  02/01/22 ?0849  ?NA 121*  ?K 3.5  ?CL 82*  ?CO2 21*  ?GLUCOSE 613*  ?BUN 70*  ?CREATININE 1.91*  ?CALCIUM 10.1  ? ?LFT ?Recent Labs  ?  02/01/22 ?0849  ?PROT 9.0*  ?ALBUMIN 4.7  ?AST 33  ?ALT 41  ?ALKPHOS 87  ?BILITOT 1.3*  ? ?PT/INR ?Recent Labs  ?  02/01/22 ?0849  ?LABPROT 14.1  ?INR 1.1  ? ? ?STUDIES: ?CT ABDOMEN PELVIS W CONTRAST ? ?Result Date: 02/01/2022 ?CLINICAL DATA:  Nausea and vomiting EXAM: CT ABDOMEN AND PELVIS WITH CONTRAST TECHNIQUE: Multidetector CT imaging of the abdomen and pelvis was performed using the standard protocol following bolus administration of intravenous contrast.  RADIATION DOSE REDUCTION: This exam was performed according to the departmental dose-optimization program which includes automated exposure control, adjustment of the mA and/or kV according to patient size

## 2022-02-01 NOTE — ED Notes (Signed)
Blood cx's x2; light green top; gray top on ice sent to lab. ?

## 2022-02-01 NOTE — ED Notes (Signed)
Glucose 613. Dr. Quentin Cornwall made aware ?

## 2022-02-02 ENCOUNTER — Encounter: Payer: Self-pay | Admitting: Internal Medicine

## 2022-02-02 ENCOUNTER — Encounter
Admission: EM | Disposition: A | Discharge status: Home / Self Care | Payer: Self-pay | Source: Home / Self Care | Attending: Internal Medicine

## 2022-02-02 ENCOUNTER — Inpatient Hospital Stay: Payer: Self-pay | Admitting: Anesthesiology

## 2022-02-02 ENCOUNTER — Inpatient Hospital Stay: Payer: Self-pay

## 2022-02-02 ENCOUNTER — Inpatient Hospital Stay (HOSPITAL_COMMUNITY)
Admit: 2022-02-02 | Discharge: 2022-02-02 | Disposition: A | Discharge status: Home / Self Care | Payer: Self-pay | Attending: Internal Medicine | Admitting: Internal Medicine

## 2022-02-02 DIAGNOSIS — I428 Other cardiomyopathies: Secondary | ICD-10-CM

## 2022-02-02 DIAGNOSIS — K8070 Calculus of gallbladder and bile duct without cholecystitis without obstruction: Secondary | ICD-10-CM

## 2022-02-02 DIAGNOSIS — K8050 Calculus of bile duct without cholangitis or cholecystitis without obstruction: Secondary | ICD-10-CM

## 2022-02-02 DIAGNOSIS — K311 Adult hypertrophic pyloric stenosis: Secondary | ICD-10-CM

## 2022-02-02 HISTORY — PX: ERCP: SHX5425

## 2022-02-02 LAB — COMPREHENSIVE METABOLIC PANEL
ALT: 31 U/L (ref 0–44)
AST: 27 U/L (ref 15–41)
Albumin: 3.7 g/dL (ref 3.5–5.0)
Alkaline Phosphatase: 64 U/L (ref 38–126)
Anion gap: 9 (ref 5–15)
BUN: 34 mg/dL — ABNORMAL HIGH (ref 6–20)
CO2: 25 mmol/L (ref 22–32)
Calcium: 8.4 mg/dL — ABNORMAL LOW (ref 8.9–10.3)
Chloride: 98 mmol/L (ref 98–111)
Creatinine, Ser: 0.92 mg/dL (ref 0.44–1.00)
GFR, Estimated: >60 mL/min (ref >60)
Glucose, Bld: 187 mg/dL — ABNORMAL HIGH (ref 70–99)
Potassium: 3.4 mmol/L — ABNORMAL LOW (ref 3.5–5.1)
Sodium: 132 mmol/L — ABNORMAL LOW (ref 135–145)
Total Bilirubin: 1 mg/dL (ref 0.3–1.2)
Total Protein: 6.7 g/dL (ref 6.5–8.1)

## 2022-02-02 LAB — ECHOCARDIOGRAM COMPLETE
AR max vel: 1.38 cm2
AV Area VTI: 1.45 cm2
AV Area mean vel: 1.26 cm2
AV Mean grad: 4 mmHg
AV Peak grad: 7.6 mmHg
Ao pk vel: 1.38 m/s
Area-P 1/2: 2.43 cm2
Calc EF: 43 %
Height: 68 in
MV VTI: 1.97 cm2
S' Lateral: 3.43 cm
Single Plane A2C EF: 43.7 %
Single Plane A4C EF: 42 %
Weight: 3200 oz

## 2022-02-02 LAB — CBG MONITORING, ED
Glucose-Capillary: 192 mg/dL — ABNORMAL HIGH (ref 70–99)
Glucose-Capillary: 186 mg/dL — ABNORMAL HIGH (ref 70–99)
Glucose-Capillary: 176 mg/dL — ABNORMAL HIGH (ref 70–99)

## 2022-02-02 LAB — URINALYSIS, ROUTINE W REFLEX MICROSCOPIC
Bilirubin Urine: NEGATIVE
Glucose, UA: >=500 mg/dL — AB
Hgb urine dipstick: NEGATIVE
Ketones, ur: 20 mg/dL — AB
Leukocytes,Ua: NEGATIVE
Nitrite: NEGATIVE
Protein, ur: NEGATIVE mg/dL
Specific Gravity, Urine: 1.018 (ref 1.005–1.030)
pH: 6 (ref 5.0–8.0)

## 2022-02-02 LAB — SURGICAL PCR SCREEN
MRSA, PCR: NEGATIVE
Staphylococcus aureus: NEGATIVE

## 2022-02-02 LAB — CBC
HCT: 37.5 % (ref 36.0–46.0)
Hemoglobin: 13.3 g/dL (ref 12.0–15.0)
MCH: 29.2 pg (ref 26.0–34.0)
MCHC: 35.5 g/dL (ref 30.0–36.0)
MCV: 82.2 fL (ref 80.0–100.0)
Platelets: 473 10*3/uL — ABNORMAL HIGH (ref 150–400)
RBC: 4.56 MIL/uL (ref 3.87–5.11)
RDW: 12.6 % (ref 11.5–15.5)
WBC: 14.6 10*3/uL — ABNORMAL HIGH (ref 4.0–10.5)
nRBC: 0 % (ref 0.0–0.2)

## 2022-02-02 LAB — POC URINE PREG, ED: Preg Test, Ur: NEGATIVE

## 2022-02-02 LAB — HEMOGLOBIN A1C
Hgb A1c MFr Bld: 7.7 % — ABNORMAL HIGH (ref 4.8–5.6)
Mean Plasma Glucose: 174 mg/dL

## 2022-02-02 LAB — GLUCOSE, CAPILLARY
Glucose-Capillary: 260 mg/dL — ABNORMAL HIGH (ref 70–99)
Glucose-Capillary: 246 mg/dL — ABNORMAL HIGH (ref 70–99)

## 2022-02-02 LAB — HIV ANTIBODY (ROUTINE TESTING W REFLEX): HIV Screen 4th Generation wRfx: NONREACTIVE

## 2022-02-02 SURGERY — ERCP, WITH INTERVENTION IF INDICATED
Anesthesia: General

## 2022-02-02 MED ORDER — PROPOFOL 500 MG/50ML IV EMUL
INTRAVENOUS | Status: DC | PRN
Start: 2022-02-02 — End: 2022-02-02
  Administered 2022-02-02: 200 ug/kg/min via INTRAVENOUS

## 2022-02-02 MED ORDER — INDOMETHACIN 50 MG RE SUPP
RECTAL | Status: AC
  Filled 2022-02-02: qty 2

## 2022-02-02 MED ORDER — PHENYLEPHRINE HCL (PRESSORS) 10 MG/ML IV SOLN
INTRAVENOUS | Status: DC | PRN
  Administered 2022-02-02: 160 ug via INTRAVENOUS

## 2022-02-02 MED ORDER — LIDOCAINE HCL (CARDIAC) PF 100 MG/5ML IV SOSY
PREFILLED_SYRINGE | INTRAVENOUS | Status: DC | PRN
  Administered 2022-02-02: 40 mg via INTRAVENOUS

## 2022-02-02 MED ORDER — ONDANSETRON HCL 4 MG/2ML IJ SOLN
INTRAMUSCULAR | Status: AC
  Filled 2022-02-02: qty 2

## 2022-02-02 MED ORDER — DEXMEDETOMIDINE (PRECEDEX) IN NS 20 MCG/5ML (4 MCG/ML) IV SYRINGE
PREFILLED_SYRINGE | INTRAVENOUS | Status: DC | PRN
  Administered 2022-02-02: 8 ug via INTRAVENOUS

## 2022-02-02 MED ORDER — PROPOFOL 500 MG/50ML IV EMUL
INTRAVENOUS | Status: AC
  Filled 2022-02-02: qty 50

## 2022-02-02 MED ORDER — PHENYLEPHRINE HCL-NACL 20-0.9 MG/250ML-% IV SOLN
INTRAVENOUS | Status: AC
  Filled 2022-02-02: qty 250

## 2022-02-02 MED ORDER — PROCHLORPERAZINE EDISYLATE 10 MG/2ML IJ SOLN
10.0000 mg | INTRAMUSCULAR | Status: DC | PRN
  Filled 2022-02-02: qty 2

## 2022-02-02 MED ORDER — INDOMETHACIN 50 MG RE SUPP: 100.0000 mg | Freq: Once | RECTAL | Status: DC

## 2022-02-02 MED ORDER — PROMETHAZINE HCL 25 MG/ML IJ SOLN
INTRAMUSCULAR | Status: AC
  Administered 2022-02-02: 6.25 mg via INTRAVENOUS
  Filled 2022-02-02: qty 1

## 2022-02-02 MED ORDER — ATORVASTATIN CALCIUM 20 MG PO TABS
40.0000 mg | ORAL_TABLET | Freq: Every day | ORAL | Status: DC
  Administered 2022-02-02 – 2022-02-04 (×3): 40 mg via ORAL
  Filled 2022-02-02 (×3): qty 2

## 2022-02-02 MED ORDER — IOHEXOL 300 MG/ML  SOLN
INTRAMUSCULAR | Status: DC | PRN
  Administered 2022-02-02: 10 mL

## 2022-02-02 MED ORDER — MUPIROCIN 2 % EX OINT
1.0000 | TOPICAL_OINTMENT | Freq: Two times a day (BID) | CUTANEOUS | Status: DC
Start: 2022-02-02 — End: 2022-02-04
  Filled 2022-02-02 (×2): qty 22

## 2022-02-02 MED ORDER — PROPOFOL 10 MG/ML IV BOLUS
INTRAVENOUS | Status: DC | PRN
  Administered 2022-02-02: 100 mg via INTRAVENOUS

## 2022-02-02 MED ORDER — INSULIN GLARGINE-YFGN 100 UNIT/ML ~~LOC~~ SOLN
5.0000 [IU] | Freq: Two times a day (BID) | SUBCUTANEOUS | Status: DC
  Administered 2022-02-02 – 2022-02-04 (×4): 5 [IU] via SUBCUTANEOUS
  Filled 2022-02-02 (×5): qty 0.05

## 2022-02-02 MED ORDER — LIVING WELL WITH DIABETES BOOK
Freq: Once | Status: AC
  Filled 2022-02-02: qty 1

## 2022-02-02 MED ORDER — INSULIN ASPART 100 UNIT/ML IJ SOLN
4.0000 [IU] | Freq: Once | INTRAMUSCULAR | Status: AC
  Administered 2022-02-02: 4 [IU] via SUBCUTANEOUS
  Filled 2022-02-02: qty 1

## 2022-02-02 MED ORDER — SODIUM CHLORIDE 0.9 % IV SOLN: INTRAVENOUS | Status: DC

## 2022-02-02 MED ORDER — INDOMETHACIN 25 MG SUPPOSITORY
RECTAL | Status: DC | PRN
  Administered 2022-02-02: 100 mg via RECTAL

## 2022-02-02 MED ORDER — LISINOPRIL 5 MG PO TABS
5.0000 mg | ORAL_TABLET | Freq: Every day | ORAL | Status: DC
  Administered 2022-02-02 – 2022-02-04 (×3): 5 mg via ORAL
  Filled 2022-02-02 (×3): qty 1

## 2022-02-02 MED ORDER — PROMETHAZINE HCL 25 MG/ML IJ SOLN: 6.2500 mg | INTRAMUSCULAR | Status: DC | PRN

## 2022-02-02 MED ORDER — PROCHLORPERAZINE EDISYLATE 10 MG/2ML IJ SOLN
5.0000 mg | Freq: Once | INTRAMUSCULAR | Status: AC
  Administered 2022-02-02: 5 mg via INTRAVENOUS
  Filled 2022-02-02: qty 2

## 2022-02-02 MED ORDER — INSULIN ASPART 100 UNIT/ML IJ SOLN
0.0000 [IU] | INTRAMUSCULAR | Status: DC
  Administered 2022-02-02: 8 [IU] via SUBCUTANEOUS
  Administered 2022-02-02: 5 [IU] via SUBCUTANEOUS
  Administered 2022-02-03: 3 [IU] via SUBCUTANEOUS
  Administered 2022-02-03: 8 [IU] via SUBCUTANEOUS
  Administered 2022-02-03: 3 [IU] via SUBCUTANEOUS
  Administered 2022-02-03: 5 [IU] via SUBCUTANEOUS
  Administered 2022-02-03 – 2022-02-04 (×2): 3 [IU] via SUBCUTANEOUS
  Administered 2022-02-04: 2 [IU] via SUBCUTANEOUS
  Filled 2022-02-02 (×9): qty 1

## 2022-02-02 NOTE — ED Notes (Signed)
Pt prepped for surgery by this tech. Pt belongings put into belongings bag with two Pt labels, belongings as follows: ?1 black sweater ?1 black and grey patterned dress ?No jewelry.  ?

## 2022-02-02 NOTE — Progress Notes (Signed)
?Progress Note ? ? ?PatientMCKINZE POIRIER JJH:[id number] DOB: [birth year] DOA: 02/01/2022     1 ?DOS: the patient was seen and examined on 02/02/2022 ?  ?Brief hospital course: ?Taken from H&P and prior notes. ? ?Krystal Gregory is a 51 y.o. female with medical history significant for nonischemic cardiomyopathy, likely secondary to Covid 19, hyperglycemia with BS 619 in setting of steroids use was in USOH till 2-3 days ago when she developed onset of N/V and RUQ pain. No fevers or chills. She came in today due to inability to take po without vomiting.  Patient also has an history of nephrolithiasis. ? ?Her review of system was significant for intentional weight loss of about 50 pounds over the past year, per patient she is trying to lose weight to promote her health.  She was also experiencing polyuria and polydipsia.  No orthopnea or PND but do have exertional dyspnea. ? ?On arrival to ED she was found to be markedly hyperglycemic with blood glucose level of 619, and negative ketones and anion gap of 18.  CT abdomen shows slight dilatation of CBD but no evidence of cholecystitis.  She was also noted to have lipase of 76.  After discussing with GI, MRCP was done which shows cholelithiasis and choledocholithiasis with mild biliary ductal dilatation.  Hepatic steatosis was also noted.  Patient underwent ERCP with GI today with removal of gallstones, GI is recommending general surgery consult for possible inpatient cholecystitis.  General surgery was consulted. ? ?Patient also received insulin infusion on arrival which has been discontinued now. ? ? ?Assessment and Plan: ?* Choledocholithiasis ?S/p ERCP with removal of bile stones.  Patient tolerated the procedure well. ?GI is recommending general surgery evaluation for possible inpatient cholecystitis. ?Received 1 dose of ceftriaxone and Flagyl, no concern of cholangitis. ?-Consult general surgery ?-Pain management ?-Zofran and Compazine for nausea and  vomiting. ?-Continue with supportive care ? ?Hyperglycemia due to diabetes mellitus ([place]) ?No prior diagnosis of diabetes but she did had hyperglycemia during COVID infection which was thought to be secondary to steroid. ?Initially received insulin infusion. ?-Start her on semglee 5 units twice daily ?-SSI ?-A1c pending ? ?Cardiomyopathy ([place]) ?Patient has an history of nonischemic cardiomyopathy, prior echo done last year with a EF of 20 to 25%. ?She was discharged home on carvedilol, lisinopril and statin but she stopped taking them. ?-Carvedilol was resumed during current hospitalization at a higher dose of 12.5 mg twice daily. ?-Repeat echo done-pending results. ?-We will add lisinopril and statin ?-Holding aspirin as she might need cholecystectomy ? ?Pancreatitis ?Elevated lipase, can be due to symptomatic cholelithiasis. ?-Supportive care ? ?HTN (hypertension) ?Blood pressure elevated.  Patient was not taking her home lisinopril,. ?-Start lisinopril at 5 mg daily-creatinine has been normalized. ?-Continue with carvedilol ?-As needed hydralazine ? 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 ? ? ?Subjective: Patient was seen after ERCP.  Pain is currently well controlled after getting morphine.  No nausea or vomiting.  Per patient she was never given a prescription for heart medication and does not know whether she has to follow-up with cardiology or not.  She feels good so she stopped taking medications. ? ?Physical Exam: ?Vitals:  ? 02/02/22 1335 02/02/22 1345 02/02/22 1355 02/02/22 1451  ?BP: (!) 169/70 (!) 173/74 136/89 (!) 180/93  ?Pulse:    65  ?Resp: '20  16 19  '$ ?Temp:    98.4 ?F (36.9 ?C)  ?TempSrc:      ?SpO2:    100%  ?  Weight:      ?Height:      ? ?General.     In no acute distress. ?Pulmonary.  Lungs clear bilaterally, normal respiratory effort. ?CV.  Regular rate and rhythm, no JVD, rub or murmur. ?Abdomen.  Soft, nontender, nondistended, BS positive. ?CNS.  Alert and oriented x3.  No focal  neurologic deficit. ?Extremities.  No edema, no cyanosis, pulses intact and symmetrical. ?Psychiatry.  Judgment and insight appears normal. ? ?Data Reviewed: ?I personally reviewed prior notes, labs and images. ? ?Family Communication: Discussed with patient ? ?Disposition: ?Status is: Inpatient ?Remains inpatient appropriate because: Severity of illness ? ? Planned Discharge Destination: Home ? ?Time spent: 50 minutes ? ?This record has been created using Systems analyst. Errors have been sought and corrected,but may not always be located. Such creation errors do not reflect on the standard of care. ? ?Author: ?Lorella Nimrod, MD ?02/02/2022 3:34 PM ? ?For on call review [email or web address].  ?

## 2022-02-02 NOTE — Op Note (Signed)
[facility] ?Gastroenterology ?Patient Name: Krystal Gregory ?Procedure Date: 02/02/2022 11:06 AM ?MRN: [id number] ?Account #: [id number] ?Date of Birth: [birth year] ?Admit Type: Outpatient ?Age: 51 ?Room: [facility] ENDO ROOM 4 ?Gender: Female ?Note Status: Finalized ?Instrument Name: [id number] ?Procedure:             ERCP ?Indications:           Bile duct stone(s) ?Providers:             Lucilla Lame MD, MD ?Medicines:             Propofol per Anesthesia ?Complications:         No immediate complications. ?Procedure:             Pre-Anesthesia Assessment: ?                       - Prior to the procedure, a History and Physical was  ?                       performed, and patient medications and allergies were  ?                       reviewed. The patient's tolerance of previous  ?                       anesthesia was also reviewed. The risks and benefits  ?                       of the procedure and the sedation options and risks  ?                       were discussed with the patient. All questions were  ?                       answered, and informed consent was obtained. Prior  ?                       Anticoagulants: The patient has taken no previous  ?                       anticoagulant or antiplatelet agents. ASA Grade  ?                       Assessment: II - A patient with mild systemic disease.  ?                       After reviewing the risks and benefits, the patient  ?                       was deemed in satisfactory condition to undergo the  ?                       procedure. ?                       After obtaining informed consent, the scope was passed  ?                       under direct vision. Throughout the procedure, the  ?  patient's blood pressure, pulse, and oxygen  ?                       saturations were monitored continuously. The  ?                       Duodenoscope was introduced through the mouth, and  ?                       used to inject contrast  into and used to inject  ?                       contrast into the bile duct. The ERCP was accomplished  ?                       without difficulty. The patient tolerated the  ?                       procedure well. ?Findings: ?     The scout film was normal. The upper GI tract was traversed under direct  ?     vision without detailed examination. A benign-appearing, intrinsic  ?     moderate stenosis was found at the pylorus. This was non-traversed. A  ?     TTS dilator was passed through the scope. Dilation with a 12-13.5-15 mm  ?     pyloric balloon dilator was performed at the pylorus. The bile duct was  ?     deeply cannulated with the short-nosed traction sphincterotome. Contrast  ?     was injected. I personally interpreted the bile duct images. There was  ?     brisk flow of contrast through the ducts. Image quality was excellent.  ?     Contrast extended to the entire biliary tree. A wire was passed into the  ?     biliary tree. A 7 mm biliary sphincterotomy was made with a traction  ?     (standard) sphincterotome using ERBE electrocautery. The sphincterotomy  ?     oozed blood. The biliary tree was swept with a 15 mm balloon starting at  ?     the bifurcation. Sludge was swept from the duct. All stones were  ?     removed. Nothing was found. ?Impression:            - Gastric stenosis was found at the pylorus. ?                       - Dilation performed at the pylorus. ?                       - Choledocholithiasis was found. Complete removal was  ?                       accomplished by biliary sphincterotomy and balloon  ?                       extraction. ?                       - A biliary sphincterotomy was performed. ?                       -  The biliary tree was swept and nothing was found. ?Recommendation:        - Return patient to hospital ward for ongoing care. ?                       - Clear liquid diet. ?                       - Continue present medications. ?                       - Watch for  pancreatitis, bleeding, perforation, and  ?                       cholangitis. ?Procedure Code(s):     --- Professional --- ?                       [phone], Endoscopic retrograde cholangiopancreatography  ?                       (ERCP); with removal of calculi/debris from  ?                       biliary/pancreatic duct(s) ?                       32992, Endoscopic retrograde cholangiopancreatography  ?                       (ERCP); with sphincterotomy/papillotomy ?                       42683, Esophagogastroduodenoscopy, flexible,  ?                       transoral; with dilation of gastric/duodenal  ?                       stricture(s) (eg, balloon, bougie) ?                       74328, Endoscopic catheterization of the biliary  ?                       ductal system, radiological supervision and  ?                       interpretation ?Diagnosis Code(s):     --- Professional --- ?                       K80.50, Calculus of bile duct without cholangitis or  ?                       cholecystitis without obstruction ?                       K31.1, Adult hypertrophic pyloric stenosis ?CPT copyright 2019 American Medical Association. All rights reserved. ?The codes documented in this report are preliminary and upon coder review may  ?be revised to meet current compliance requirements. ?Lucilla Lame MD, MD ?02/02/2022 12:44:27 PM ?This report has been signed electronically. ?Number of Addenda: 0 ?Note Initiated On: 02/02/2022 11:06 AM ?Estimated Blood Loss:  Estimated blood loss: none. ?     [facility] ?

## 2022-02-02 NOTE — Assessment & Plan Note (Signed)
S/p dilatation by GI today. ?

## 2022-02-02 NOTE — Transfer of Care (Signed)
Immediate Anesthesia Transfer of Care Note ? ?Patient: Krystal Gregory Tissue ? ?Procedure(s) Performed: Procedure(s): ?ENDOSCOPIC RETROGRADE CHOLANGIOPANCREATOGRAPHY (ERCP) (N/A) ? ?Patient Location: PACU and Endoscopy Unit ? ?Anesthesia Type:General ? ?Level of Consciousness: sedated ? ?Airway & Oxygen Therapy: Patient Spontanous Breathing and Patient connected to nasal cannula oxygen ? ?Post-op Assessment: Report given to RN and Post -op Vital signs reviewed and stable ? ?Post vital signs: Reviewed and stable ? ?Last Vitals:  ?Vitals:  ? 02/02/22 0800 02/02/22 1126  ?BP: (!) 151/89 (!) 148/92  ?Pulse: 91 94  ?Resp: (!) 22 20  ?Temp: 36.9 ?C 36.9 ?C  ?SpO2: 92% 94%  ? ? ?Complications: No apparent anesthesia complications ?

## 2022-02-02 NOTE — Hospital Course (Addendum)
Taken from H&P and prior notes. ? ?Krystal Gregory is a 51 y.o. female with medical history significant for nonischemic cardiomyopathy, likely secondary to Covid 19, hyperglycemia with BS 619 in setting of steroids use was in USOH till 2-3 days ago when she developed onset of N/V and RUQ pain. No fevers or chills. She came in today due to inability to take po without vomiting.  Patient also has an history of nephrolithiasis. ? ?Her review of system was significant for intentional weight loss of about 50 pounds over the past year, per patient she is trying to lose weight to promote her health.  She was also experiencing polyuria and polydipsia.  No orthopnea or PND but do have exertional dyspnea. ? ?On arrival to ED she was found to be markedly hyperglycemic with blood glucose level of 619, and negative ketones and anion gap of 18.  CT abdomen shows slight dilatation of CBD but no evidence of cholecystitis.  She was also noted to have lipase of 76.  After discussing with GI, MRCP was done which shows cholelithiasis and choledocholithiasis with mild biliary ductal dilatation.  Hepatic steatosis was also noted.  Patient underwent ERCP with GI today with removal of gallstones, GI is recommending general surgery consult for possible inpatient cholecystitis.  General surgery was consulted. ? ?On EGD she was also found to have pyloric stenosis which was dilated by GI on 02/02/2022. ? ?Patient also received insulin infusion on arrival which has been discontinued now. ? ?Patient underwent laparoscopic cholecystectomy with general surgery on 02/03/2022 and tolerated the procedure well.  She was feeling much improved and not much pain.  She does not want narcotics for pain and can use Tylenol and Motrin as needed. ?She will follow-up with general surgery in 1 to 2 weeks. ? ?Patient was also started on metformin for her new diagnosis of diabetes.  She does not have insurance but need to see a PCP regularly for further titration  and monitoring.  A1c of 7.7. ?Diabetic education was provided. ? ?As patient stopped taking all of her medications, we resumed her home aspirin and Crestor. ?We increased the dose of carvedilol and lisinopril for better control of her blood pressure. ?Repeat echocardiogram with some improvement of EF to 40 to 45% with grade 1 diastolic dysfunction.  She needs a close follow-up with cardiology for further recommendations. ? ?Patient needs to continue with current medications and follow-up closely with her providers for further recommendations. ?

## 2022-02-02 NOTE — Assessment & Plan Note (Signed)
Blood pressure elevated.  Patient was not taking her home lisinopril,. ?-Start lisinopril at 5 mg daily-creatinine has been normalized. ?-Continue with carvedilol ?-As needed hydralazine ?

## 2022-02-02 NOTE — Anesthesia Postprocedure Evaluation (Addendum)
Anesthesia Post Note ? ?Patient: Krystal Gregory ? ?Procedure(s) Performed: ENDOSCOPIC RETROGRADE CHOLANGIOPANCREATOGRAPHY (ERCP) ? ?Patient location during evaluation: Endoscopy ?Anesthesia Type: General ?Level of consciousness: awake and alert ?Pain management: pain level controlled ?Vital Signs Assessment: post-procedure vital signs reviewed and stable ?Respiratory status: spontaneous breathing, nonlabored ventilation, respiratory function stable and patient connected to nasal cannula oxygen ?Cardiovascular status: blood pressure returned to baseline and stable ?Postop Assessment: no apparent nausea or vomiting ?Anesthetic complications: no ? ? ?No notable events documented. ? ? ?Last Vitals:  ?Vitals:  ? 02/02/22 1255 02/02/22 1315  ?BP:    ?Pulse:    ?Resp: 20 19  ?Temp:    ?SpO2:    ?  ?Last Pain:  ?Vitals:  ? 02/02/22 1325  ?TempSrc:   ?PainSc: 0-No pain  ? ? ?  ?  ?  ?  ?  ?  ? ?Precious Haws [name] ? ? ? ? ?

## 2022-02-02 NOTE — Progress Notes (Signed)
Inpatient Diabetes Program Recommendations ? ?AACE/ADA: New Consensus Statement on Inpatient Glycemic Control (2015) ? ?Target Ranges:  Prepandial:   less than 140 mg/dL ?     Peak postprandial:   less than 180 mg/dL (1-2 hours) ?     Critically ill patients:  140 - 180 mg/dL  ? ? Latest Reference Range & Units 02/01/22 08:49  ?Sodium 135 - 145 mmol/L 121 (L)  ?Potassium 3.5 - 5.1 mmol/L 3.5  ?Chloride 98 - 111 mmol/L 82 (L)  ?CO2 22 - 32 mmol/L 21 (L)  ?Glucose 70 - 99 mg/dL 613 (HH)  ?BUN 6 - 20 mg/dL 70 (H)  ?Creatinine 0.44 - 1.00 mg/dL 1.91 (H)  ?Calcium 8.9 - 10.3 mg/dL 10.1  ?Anion gap 5 - 15  18 (H)  ? ? Latest Reference Range & Units 02/01/22 14:33 02/01/22 18:15 02/01/22 23:25 02/02/22 02:48 02/02/22 06:57  ?Glucose-Capillary 70 - 99 mg/dL ? ?10 units NOVOLOG '@1003'$  324 (H) ? ?IV Insulin Drip Started '@1506'$  145 (H) ? ?IV Insulin Drip Stopped '@1830'$  236 (H) ? ?4 units Novolog ? 192 (H) 186 (H)  ? ? ?Admit with:  ?Hyperglycemia with new onset DM ?RUQ pain/N/V and mild pancreatitis ?Significant weight loss of 50 pounds over the past year ? ?History: Cardiomyopathy ? ?Current Insulin Orders: None ? ? ? ?NPO for ERCP today ? ?IV Insulin Drip was started at 3pm yesterday--NO drip rates documented between 4pm through 5pm and then IV Insulin Drip abruptly stopped at 6:30pm--No current orders for SQ Insulin ? ?Current BMET from 6:38am shows the following: ?Glucose 187/ Anion Gap 9/ CO2 level 25 ? ?Looks like pt had Hyperglycemia during admission in Janurary 2023 when she had COVID--Was given Rx for CBG meter and told to follow up with PCP ? ? ? ?MD- Please consider adding Novolog Sensitive Correction Scale/ SSI (0-9 units) Q4 hours ? ? ? ? ?--Will follow patient during hospitalization-- ? ?Wyn Quaker RN, MSN, CDE ?Diabetes Coordinator ?Inpatient Glycemic Control Team ?Team Pager: [phone] (8a-5p) ? ? ? ?

## 2022-02-02 NOTE — Assessment & Plan Note (Signed)
S/p ERCP with removal of bile stones.  Patient tolerated the procedure well. ?GI is recommending general surgery evaluation for possible inpatient cholecystitis. ?Received 1 dose of ceftriaxone and Flagyl, no concern of cholangitis. ?-Consult general surgery ?-Pain management ?-Zofran and Compazine for nausea and vomiting. ?-Continue with supportive care ?

## 2022-02-02 NOTE — Care Plan (Signed)
ERCP performed. Please see procedure note from Dr. Allen Norris. Needs surgical consult for consideration of inpatient cholecystectomy. ? ?Raylene Miyamoto MD, MPH ?[facility] ?

## 2022-02-02 NOTE — Progress Notes (Signed)
Admission profile updated. ?

## 2022-02-02 NOTE — ED Notes (Signed)
CBG 176 '@0800'$  ?

## 2022-02-02 NOTE — Consult Note (Signed)
[facility] ?SURGICAL CONSULTATION NOTE (initial) - cpt: 87867 ? ? ?HISTORY OF PRESENT ILLNESS (HPI):  ?51 y.o. female presented to [facility] ED yesterday for evaluation of abdominal pain. Patient reports nausea/vomiting and right upper quadrant pain ?MRCP showed common bile duct stone of 6 mm, and gallbladder with multiple stones present. ?She underwent ERCP today with common bile duct clearance. ?Surgery is consulted by hospitalist physician Dr. Reesa Chew in this context for evaluation and management of gallstones.  Liver function tests today were within normal limits. ?Of note she has a history of cardiomyopathy with an echo done last year showing an ejection fraction of 20 to 25%. ? ?PAST MEDICAL HISTORY (PMH):  ?Past Medical History:  ?Diagnosis Date  ? Cardiomyopathy ([place])   ? a. 11/2020 Echo: EF 20-25%, no rwma, glob HK, nl RV size/fxn.  ? COVID-19 virus infection 11/2020  ? Migraines   ?  ? ?PAST SURGICAL HISTORY ([place]):  ?Past Surgical History:  ?Procedure Laterality Date  ? RIGHT/LEFT HEART CATH AND CORONARY ANGIOGRAPHY N/A 12/02/2020  ? Procedure: RIGHT/LEFT HEART CATH AND CORONARY ANGIOGRAPHY;  Surgeon: Wellington Hampshire, MD;  Location: [place] [facility];  Service: Cardiovascular;  Laterality: N/A;  ?  ? ?MEDICATIONS:  ?Prior to Admission medications   ?Medication Sig Start Date End Date Taking? Authorizing Provider  ?acetaminophen (TYLENOL) 500 MG tablet Take 1,000 mg by mouth every 6 (six) hours as needed.   Yes [provider]  ?Aspirin-Acetaminophen (GOODYS BODY PAIN PO) Take 1 packet by mouth as needed.   Yes [provider]  ?ibuprofen (ADVIL) 200 MG tablet Take 400 mg by mouth every 6 (six) hours as needed.   Yes [provider]  ?SUMAtriptan (IMITREX) 100 MG tablet Take 100 mg by mouth every 2 (two) hours as needed for migraine. May repeat in 2 hours if headache persists or recurs.   Yes [provider]  ?albuterol (VENTOLIN HFA) 108 (90 Base) MCG/ACT  inhaler Inhale 2 puffs into the lungs every 4 (four) hours as needed for wheezing or shortness of breath. 12/03/20   Fritzi Mandes, MD  ?aspirin EC 81 MG EC tablet Take 1 tablet (81 mg total) by mouth daily. Swallow whole. ?Patient not taking: Reported on 02/01/2022 12/04/20   Fritzi Mandes, MD  ?atorvastatin (LIPITOR) 40 MG tablet Take 1 tablet (40 mg total) by mouth daily. ?Patient not taking: Reported on 02/01/2022 12/04/20   Fritzi Mandes, MD  ?blood glucose meter kit and supplies KIT Dispense based on patient and insurance preference. Use up to four times daily as directed. (FOR ICD-9 250.00, 250.01). 12/03/20   Fritzi Mandes, MD  ?carvedilol (COREG) 3.125 MG tablet Take 1 tablet (3.125 mg total) by mouth 2 (two) times daily with a meal. ?Patient not taking: Reported on 02/01/2022 12/03/20   Fritzi Mandes, MD  ?lisinopril (ZESTRIL) 2.5 MG tablet Take 1 tablet (2.5 mg total) by mouth daily. 12/23/20 03/23/21  Furth, Cadence H, PA-C  ?promethazine (PHENERGAN) 12.5 MG tablet Take 1 tablet (12.5 mg total) by mouth daily as needed for nausea or vomiting. ?Patient not taking: Reported on 02/01/2022 12/23/20   Kathlen Mody, Cadence H, PA-C  ?  ? ?ALLERGIES:  ?Allergies  ?Allergen Reactions  ? Penicillins Rash  ?  ? ?SOCIAL HISTORY:  ?Social History  ? ?Socioeconomic History  ? Marital status: Single  ?  Spouse name: Not on file  ? Number of children: Not on file  ? Years of education: Not on file  ? Highest education level:  Not on file  ?Occupational History  ? Not on file  ?Tobacco Use  ? Smoking status: Former  ?  Types: Cigarettes  ?  Quit date: 05/16/1999  ?  Years since quitting: 22.7  ? Smokeless tobacco: Never  ?Substance and Sexual Activity  ? Alcohol use: Yes  ?  Comment: 2 bottles wine per month  ? Drug use: Yes  ?  Types: Marijuana  ? Sexual activity: Not on file  ?Other Topics Concern  ? Not on file  ?Social History Narrative  ? Not on file  ? ?Social Determinants of Health  ? ?Financial Resource Strain: Not on file  ?Food Insecurity: Not  on file  ?Transportation Needs: Not on file  ?Physical Activity: Not on file  ?Stress: Not on file  ?Social Connections: Not on file  ?Intimate Partner Violence: Not on file  ?  ? ?FAMILY HISTORY:  ?History reviewed. No pertinent family history.  ? ? ?REVIEW OF SYSTEMS:  ?Review of Systems  ?All other systems reviewed and are negative. ? ?VITAL SIGNS:  ?Temp:  [98.2 ?F (36.8 ?C)-98.5 ?F (36.9 ?C)] 98.4 ?F (36.9 ?C) (03/20 1451) ?Pulse Rate:  [47-103] 65 (03/20 1451) ?Resp:  [12-24] 19 (03/20 1451) ?BP: (103-180)/(53-109) 180/93 (03/20 1451) ?SpO2:  [92 %-100 %] 100 % (03/20 1451) ?Weight:  [90.7 kg] 90.7 kg (03/20 1126)     Height: 5' 8"  (172.7 cm) Weight: 90.7 kg BMI (Calculated): 30.42  ? ?INTAKE/OUTPUT:  ?03/19 0701 - 03/20 0700 ?In: 1615.5 [I.V.:15.5; IV [id number]] ?Out: -  ? ?PHYSICAL EXAM:  ?Physical Exam ?Blood pressure (!) 180/93, pulse 65, temperature 98.4 ?F (36.9 ?C), resp. rate 19, height 5' 8"  (1.727 m), weight 90.7 kg, SpO2 100 %. ?Last Weight  Most recent update: 02/02/2022 11:38 AM  ? ? Weight  ?90.7 kg (200 lb)  ?      ? ?  ? ? ?CONSTITUTIONAL: Well developed, and nourished, appropriately responsive and aware without distress.   ?EYES: Sclera non-icteric.   ?EARS, NOSE, MOUTH AND THROAT:  The oropharynx is clear. Oral mucosa is pink and moist.     Hearing is intact to voice.  ?NECK: Trachea is midline, and there is no jugular venous distension.  ?LYMPH NODES:  Lymph nodes in the neck are not enlarged. ?RESPIRATORY:  Lungs are clear, and breath sounds are equal bilaterally. Normal respiratory effort without pathologic use of accessory muscles. ?CARDIOVASCULAR: Heart is regular in rate and rhythm. ?GI: The abdomen is soft, nontender, and nondistended. There were no palpable masses. I did not appreciate hepatosplenomegaly. There were normal bowel sounds. ?MUSCULOSKELETAL:  Symmetrical muscle tone appreciated in all four extremities.    ?SKIN: Skin turgor is normal. No pathologic skin lesions  appreciated.  ?NEUROLOGIC:  Motor and sensation appear grossly normal.  Cranial nerves are grossly without defect. ?PSYCH:  Alert and oriented to person, place and time. Affect is appropriate for situation. ? ?Data Reviewed ?I have personally reviewed what is currently available of the patient's imaging, recent labs and medical records.   ? ?Labs:  ?CBC Latest Ref Rng & Units 02/02/2022 02/01/2022 12/23/2020  ?WBC 4.0 - 10.5 K/uL 14.6(H) 19.1(H) 17.9(H)  ?Hemoglobin 12.0 - 15.0 g/dL 13.3 17.6(H) 15.5  ?Hematocrit 36.0 - 46.0 % 37.5 48.3(H) 44.3  ?Platelets 150 - 400 K/uL 473(H) 698(H) 528(H)  ? ?CMP Latest Ref Rng & Units 02/02/2022 02/01/2022 12/23/2020  ?Glucose 70 - 99 mg/dL 187(H) 613(HH) 313(H)  ?BUN 6 - 20 mg/dL 34(H) 70(H) 12  ?Creatinine 0.44 -  1.00 mg/dL 0.92 1.91(H) 0.64  ?Sodium 135 - 145 mmol/L 132(L) 121(L) 137  ?Potassium 3.5 - 5.1 mmol/L 3.4(L) 3.5 4.2  ?Chloride 98 - 111 mmol/L 98 82(L) 99  ?CO2 22 - 32 mmol/L 25 21(L) 20  ?Calcium 8.9 - 10.3 mg/dL 8.4(L) 10.1 9.8  ?Total Protein 6.5 - 8.1 g/dL 6.7 9.0(H) -  ?Total Bilirubin 0.3 - 1.2 mg/dL 1.0 1.3(H) -  ?Alkaline Phos 38 - 126 U/L 64 87 -  ?AST 15 - 41 U/L 27 33 -  ?ALT 0 - 44 U/L 31 41 -  ? ? ? ?Imaging studies:  ? ?Last 24 hrs: DG C-Arm 1-60 Min-No Report ? ?Result Date: 02/02/2022 ?Fluoroscopy was utilized by the requesting physician.  No radiographic interpretation.   ? ? ?Assessment/Plan:  ?51 y.o. female with cholelithiasis, and history of choledocholithiasis status post ERCP with common bile duct clearance, complicated by pertinent comorbidities including:  ? ?Patient Active Problem List  ? Diagnosis Date Noted  ? Choledocholithiasis   ? Pyloric stenosis   ? Hyperglycemia due to diabetes mellitus ([place]) 02/01/2022  ? HTN (hypertension) 02/01/2022  ? Pancreatitis 02/01/2022  ? History of kidney stones 02/01/2022  ? Hydronephrosis 02/01/2022  ? Acute systolic heart failure ([place])   ? Non-ST elevation (NSTEMI) myocardial infarction [facility]   ? Pneumonia due  to COVID-19 virus 11/29/2020  ? Elevated blood pressure reading 11/29/2020  ? Hyperglycemia 11/29/2020  ? Sepsis ([place]) 11/29/2020  ? Acute respiratory failure with hypoxia ([place]) 11/29/2020  ? Migraines   ?

## 2022-02-02 NOTE — ED Notes (Signed)
This RN accepted pt at approx 2330. Pt insulin drip completely stopped at 1830 by previous RN. No subcutaneous or basal insulin ordered prior to abrupt discontinuation. Pt BGL on pt's arrival to this RN 236. Pt actively nauseous and vomiting. Provider made aware of pt's need for subcutaneous insulin and a medication besides Zofran for n/v.  ?

## 2022-02-02 NOTE — Assessment & Plan Note (Signed)
No prior diagnosis of diabetes but she did had hyperglycemia during COVID infection which was thought to be secondary to steroid. ?Initially received insulin infusion. ?-Start her on semglee 5 units twice daily ?-SSI ?-A1c pending ?

## 2022-02-02 NOTE — Anesthesia Preprocedure Evaluation (Signed)
Anesthesia Evaluation  ?Patient identified by MRN, date of birth, ID band ?Patient awake ? ? ? ?Reviewed: ?Allergy & Precautions, NPO status , Patient's Chart, lab work & pertinent test results ? ?History of Anesthesia Complications ?Negative for: history of anesthetic complications ? ?Airway ?Mallampati: II ? ?TM Distance: >3 FB ?Neck ROM: full ? ? ? Dental ? ?(+) Chipped ?  ?Pulmonary ?neg shortness of breath, former smoker,  ?  ?Pulmonary exam normal ? ? ? ? ? ? ? Cardiovascular ?hypertension, +CHF  ?Normal cardiovascular exam ? ? ?  ?Neuro/Psych ? Headaches, PSYCHIATRIC DISORDERS   ? GI/Hepatic ?negative GI ROS, Neg liver ROS,   ?Endo/Other  ?diabetes ? Renal/GU ?Renal disease  ?negative genitourinary ?  ?Musculoskeletal ? ? Abdominal ?  ?Peds ? Hematology ?negative hematology ROS ?(+)   ?Anesthesia Other Findings ?Past Medical History: ?No date: Cardiomyopathy [facility]) ?    Comment:  a. 11/2020 Echo: EF 20-25%, no rwma, glob HK, nl RV  ?             size/fxn. ?11/2020: COVID-19 virus infection ?No date: Migraines ? ?Past Surgical History: ?12/02/2020: RIGHT/LEFT HEART CATH AND CORONARY ANGIOGRAPHY; N/A ?    Comment:  Procedure: RIGHT/LEFT HEART CATH AND CORONARY  ?             ANGIOGRAPHY;  Surgeon: Wellington Hampshire, MD;  Location:  ?             [place] [facility];  Service: Cardiovascular;   ?             Laterality: N/A; ? ?BMI   ? Body Mass Index: 30.41 kg/m?  ?  ? ? Reproductive/Obstetrics ?negative OB ROS ? ?  ? ? ? ? ? ? ? ? ? ? ? ? ? ?  ?  ? ? ? ? ? ? ? ? ?Anesthesia Physical ?Anesthesia Plan ? ?ASA: 3 ? ?Anesthesia Plan: General  ? ?Post-op Pain Management:   ? ?Induction: Intravenous ? ?PONV Risk Score and Plan: Propofol infusion and TIVA ? ?Airway Management Planned: Natural Airway and Nasal Cannula ? ?Additional Equipment:  ? ?Intra-op Plan:  ? ?Post-operative Plan:  ? ?Informed Consent: I have reviewed the patients History and Physical, chart, labs and discussed the  procedure including the risks, benefits and alternatives for the proposed anesthesia with the patient or authorized representative who has indicated his/her understanding and acceptance.  ? ? ? ?Dental Advisory Given ? ?Plan Discussed with: Anesthesiologist, CRNA and Surgeon ? ?Anesthesia Plan Comments: (Patient consented for risks of anesthesia including but not limited to:  ?- adverse reactions to medications ?- risk of airway placement if required ?- damage to eyes, teeth, lips or other oral mucosa ?- nerve damage due to positioning  ?- sore throat or hoarseness ?- Damage to heart, brain, nerves, lungs, other parts of body or loss of life ? ?Patient voiced understanding.)  ? ? ? ? ? ? ?Anesthesia Quick Evaluation ? ?

## 2022-02-02 NOTE — Assessment & Plan Note (Signed)
Elevated lipase, can be due to symptomatic cholelithiasis. ?-Supportive care ?

## 2022-02-02 NOTE — Progress Notes (Signed)
*  PRELIMINARY RESULTS* ?Echocardiogram ?2D Echocardiogram has been performed. ? ?Krystal Gregory ?02/02/2022, 11:19 AM ?

## 2022-02-02 NOTE — Assessment & Plan Note (Signed)
Patient has an history of nonischemic cardiomyopathy, prior echo done last year with a EF of 20 to 25%. ?She was discharged home on carvedilol, lisinopril and statin but she stopped taking them. ?-Carvedilol was resumed during current hospitalization at a higher dose of 12.5 mg twice daily. ?-Repeat echo done-pending results. ?-We will add lisinopril and statin ?-Holding aspirin as she might need cholecystectomy ?

## 2022-02-03 ENCOUNTER — Inpatient Hospital Stay: Payer: Self-pay | Admitting: Anesthesiology

## 2022-02-03 ENCOUNTER — Encounter
Admission: EM | Disposition: A | Discharge status: Home / Self Care | Payer: Self-pay | Source: Home / Self Care | Attending: Internal Medicine

## 2022-02-03 ENCOUNTER — Encounter: Payer: Self-pay | Admitting: Internal Medicine

## 2022-02-03 DIAGNOSIS — K8064 Calculus of gallbladder and bile duct with chronic cholecystitis without obstruction: Principal | ICD-10-CM

## 2022-02-03 LAB — GLUCOSE, CAPILLARY
Glucose-Capillary: 156 mg/dL — ABNORMAL HIGH (ref 70–99)
Glucose-Capillary: 165 mg/dL — ABNORMAL HIGH (ref 70–99)
Glucose-Capillary: 184 mg/dL — ABNORMAL HIGH (ref 70–99)
Glucose-Capillary: 188 mg/dL — ABNORMAL HIGH (ref 70–99)
Glucose-Capillary: 193 mg/dL — ABNORMAL HIGH (ref 70–99)
Glucose-Capillary: 196 mg/dL — ABNORMAL HIGH (ref 70–99)
Glucose-Capillary: 208 mg/dL — ABNORMAL HIGH (ref 70–99)
Glucose-Capillary: 290 mg/dL — ABNORMAL HIGH (ref 70–99)
Glucose-Capillary: 296 mg/dL — ABNORMAL HIGH (ref 70–99)

## 2022-02-03 LAB — URINALYSIS, COMPLETE (UACMP) WITH MICROSCOPIC
Bacteria, UA: NONE SEEN
Bilirubin Urine: NEGATIVE
Glucose, UA: >=500 mg/dL — AB
Hgb urine dipstick: NEGATIVE
Ketones, ur: 5 mg/dL — AB
Leukocytes,Ua: NEGATIVE
Nitrite: NEGATIVE
Protein, ur: NEGATIVE mg/dL
Specific Gravity, Urine: 1.027 (ref 1.005–1.030)
pH: 6 (ref 5.0–8.0)

## 2022-02-03 SURGERY — CHOLECYSTECTOMY, ROBOT-ASSISTED, LAPAROSCOPIC
Anesthesia: General

## 2022-02-03 MED ORDER — FENTANYL CITRATE (PF) 100 MCG/2ML IJ SOLN
INTRAMUSCULAR | Status: AC
  Filled 2022-02-03: qty 2

## 2022-02-03 MED ORDER — BUPIVACAINE LIPOSOME 1.3 % IJ SUSP
INTRAMUSCULAR | Status: AC
  Filled 2022-02-03: qty 20

## 2022-02-03 MED ORDER — PROPOFOL 10 MG/ML IV BOLUS
INTRAVENOUS | Status: DC | PRN
  Administered 2022-02-03: 20 mg via INTRAVENOUS
  Administered 2022-02-03: 120 mg via INTRAVENOUS

## 2022-02-03 MED ORDER — INDOCYANINE GREEN 25 MG IV SOLR
2.5000 mg | Freq: Once | INTRAVENOUS | Status: AC
Start: 2022-02-03 — End: 2022-02-03
  Administered 2022-02-03: 2.5 mg via INTRAVENOUS
  Filled 2022-02-03: qty 1

## 2022-02-03 MED ORDER — VASOPRESSIN 20 UNIT/ML IV SOLN
INTRAVENOUS | Status: DC | PRN
  Administered 2022-02-03: 1 [IU] via INTRAVENOUS
  Administered 2022-02-03: .5 [IU] via INTRAVENOUS

## 2022-02-03 MED ORDER — CEFTRIAXONE SODIUM 1 G IJ SOLR
INTRAMUSCULAR | Status: AC
  Filled 2022-02-03: qty 10

## 2022-02-03 MED ORDER — DEXAMETHASONE SODIUM PHOSPHATE 10 MG/ML IJ SOLN
INTRAMUSCULAR | Status: DC | PRN
  Administered 2022-02-03: 5 mg via INTRAVENOUS

## 2022-02-03 MED ORDER — FENTANYL CITRATE (PF) 100 MCG/2ML IJ SOLN
25.0000 ug | INTRAMUSCULAR | Status: DC | PRN
  Administered 2022-02-03: 25 ug via INTRAVENOUS

## 2022-02-03 MED ORDER — OXYCODONE HCL 5 MG/5ML PO SOLN: 5.0000 mg | Freq: Once | ORAL | Status: DC | PRN

## 2022-02-03 MED ORDER — PANTOPRAZOLE SODIUM 40 MG IV SOLR
40.0000 mg | Freq: Every day | INTRAVENOUS | Status: DC
  Administered 2022-02-03: 40 mg via INTRAVENOUS
  Filled 2022-02-03 (×2): qty 10

## 2022-02-03 MED ORDER — EPHEDRINE SULFATE (PRESSORS) 50 MG/ML IJ SOLN
INTRAMUSCULAR | Status: DC | PRN
  Administered 2022-02-03: 5 mg via INTRAVENOUS
  Administered 2022-02-03: 10 mg via INTRAVENOUS
  Administered 2022-02-03 (×3): 5 mg via INTRAVENOUS
  Administered 2022-02-03: 15 mg via INTRAVENOUS
  Administered 2022-02-03: 5 mg via INTRAVENOUS

## 2022-02-03 MED ORDER — OXYCODONE HCL 5 MG PO TABS: 5.0000 mg | ORAL_TABLET | Freq: Once | ORAL | Status: DC | PRN

## 2022-02-03 MED ORDER — LACTATED RINGERS IV SOLN: INTRAVENOUS | Status: DC

## 2022-02-03 MED ORDER — CEFTRIAXONE SODIUM 2 G IJ SOLR
INTRAMUSCULAR | Status: AC
  Filled 2022-02-03: qty 20

## 2022-02-03 MED ORDER — ONDANSETRON HCL 4 MG/2ML IJ SOLN
INTRAMUSCULAR | Status: DC | PRN
  Administered 2022-02-03: 4 mg via INTRAVENOUS

## 2022-02-03 MED ORDER — DIPHENHYDRAMINE HCL 50 MG/ML IJ SOLN
INTRAMUSCULAR | Status: AC
  Filled 2022-02-03: qty 1

## 2022-02-03 MED ORDER — PHENYLEPHRINE HCL-NACL 20-0.9 MG/250ML-% IV SOLN
INTRAVENOUS | Status: DC | PRN
  Administered 2022-02-03: 50 ug/min via INTRAVENOUS

## 2022-02-03 MED ORDER — MIDAZOLAM HCL 2 MG/2ML IJ SOLN
INTRAMUSCULAR | Status: AC
Start: 2022-02-03 — End: ?
  Filled 2022-02-03: qty 2

## 2022-02-03 MED ORDER — PHENYLEPHRINE HCL (PRESSORS) 10 MG/ML IV SOLN
INTRAVENOUS | Status: DC | PRN
  Administered 2022-02-03 (×2): 80 ug via INTRAVENOUS
  Administered 2022-02-03: 160 ug via INTRAVENOUS
  Administered 2022-02-03 (×2): 80 ug via INTRAVENOUS

## 2022-02-03 MED ORDER — EPHEDRINE 5 MG/ML INJ
INTRAVENOUS | Status: AC
  Filled 2022-02-03: qty 10

## 2022-02-03 MED ORDER — MUSCLE RUB 10-15 % EX CREA
TOPICAL_CREAM | CUTANEOUS | Status: DC | PRN
  Administered 2022-02-03: 1 via TOPICAL
  Filled 2022-02-03: qty 85

## 2022-02-03 MED ORDER — PHENYLEPHRINE 40 MCG/ML (10ML) SYRINGE FOR IV PUSH (FOR BLOOD PRESSURE SUPPORT)
PREFILLED_SYRINGE | INTRAVENOUS | Status: DC | PRN
  Administered 2022-02-03: 80 ug via INTRAVENOUS

## 2022-02-03 MED ORDER — MIDAZOLAM HCL 2 MG/2ML IJ SOLN
INTRAMUSCULAR | Status: DC | PRN
  Administered 2022-02-03: 1 mg via INTRAVENOUS

## 2022-02-03 MED ORDER — BUPIVACAINE-EPINEPHRINE (PF) 0.25% -1:200000 IJ SOLN
INTRAMUSCULAR | Status: DC | PRN
  Administered 2022-02-03: 23 mL via PERINEURAL

## 2022-02-03 MED ORDER — DEXAMETHASONE SODIUM PHOSPHATE 10 MG/ML IJ SOLN
INTRAMUSCULAR | Status: AC
  Filled 2022-02-03: qty 1

## 2022-02-03 MED ORDER — SUGAMMADEX SODIUM 200 MG/2ML IV SOLN
INTRAVENOUS | Status: DC | PRN
Start: 2022-02-03 — End: 2022-02-03
  Administered 2022-02-03: 200 mg via INTRAVENOUS

## 2022-02-03 MED ORDER — LIDOCAINE HCL (CARDIAC) PF 100 MG/5ML IV SOSY
PREFILLED_SYRINGE | INTRAVENOUS | Status: DC | PRN
  Administered 2022-02-03: 100 mg via INTRAVENOUS

## 2022-02-03 MED ORDER — ONDANSETRON HCL 4 MG/2ML IJ SOLN
INTRAMUSCULAR | Status: AC
  Filled 2022-02-03: qty 2

## 2022-02-03 MED ORDER — 0.9 % SODIUM CHLORIDE (POUR BTL) OPTIME
TOPICAL | Status: DC | PRN
  Administered 2022-02-03: 500 mL

## 2022-02-03 MED ORDER — METRONIDAZOLE 500 MG/100ML IV SOLN
500.0000 mg | Freq: Two times a day (BID) | INTRAVENOUS | Status: DC
  Administered 2022-02-03 – 2022-02-04 (×2): 500 mg via INTRAVENOUS
  Filled 2022-02-03 (×2): qty 100

## 2022-02-03 MED ORDER — FENTANYL CITRATE (PF) 100 MCG/2ML IJ SOLN
INTRAMUSCULAR | Status: DC | PRN
  Administered 2022-02-03 (×2): 25 ug via INTRAVENOUS
  Administered 2022-02-03: 50 ug via INTRAVENOUS

## 2022-02-03 MED ORDER — VASOPRESSIN 20 UNIT/ML IV SOLN
INTRAVENOUS | Status: AC
  Filled 2022-02-03: qty 1

## 2022-02-03 MED ORDER — GLYCOPYRROLATE 0.2 MG/ML IJ SOLN
INTRAMUSCULAR | Status: DC | PRN
  Administered 2022-02-03: .2 mg via INTRAVENOUS

## 2022-02-03 MED ORDER — DEXTROSE 5 % IV SOLN
INTRAVENOUS | Status: DC | PRN
  Administered 2022-02-03: 2 g via INTRAVENOUS

## 2022-02-03 MED ORDER — ACETAMINOPHEN 10 MG/ML IV SOLN
INTRAVENOUS | Status: DC | PRN
  Administered 2022-02-03: 1000 mg via INTRAVENOUS

## 2022-02-03 MED ORDER — BUPIVACAINE-EPINEPHRINE (PF) 0.25% -1:200000 IJ SOLN
INTRAMUSCULAR | Status: AC
  Filled 2022-02-03: qty 30

## 2022-02-03 MED ORDER — PROPOFOL 10 MG/ML IV BOLUS
INTRAVENOUS | Status: AC
  Filled 2022-02-03: qty 20

## 2022-02-03 MED ORDER — PHENYLEPHRINE 40 MCG/ML (10ML) SYRINGE FOR IV PUSH (FOR BLOOD PRESSURE SUPPORT)
PREFILLED_SYRINGE | INTRAVENOUS | Status: AC
  Filled 2022-02-03: qty 10

## 2022-02-03 MED ORDER — LIDOCAINE HCL (PF) 2 % IJ SOLN
INTRAMUSCULAR | Status: AC
  Filled 2022-02-03: qty 5

## 2022-02-03 MED ORDER — ACETAMINOPHEN 10 MG/ML IV SOLN
INTRAVENOUS | Status: AC
  Filled 2022-02-03: qty 100

## 2022-02-03 MED ORDER — PHENYLEPHRINE HCL-NACL 20-0.9 MG/250ML-% IV SOLN
INTRAVENOUS | Status: AC
  Filled 2022-02-03: qty 250

## 2022-02-03 MED ORDER — ROCURONIUM BROMIDE 100 MG/10ML IV SOLN
INTRAVENOUS | Status: DC | PRN
  Administered 2022-02-03: 10 mg via INTRAVENOUS
  Administered 2022-02-03: 20 mg via INTRAVENOUS
  Administered 2022-02-03: 50 mg via INTRAVENOUS

## 2022-02-03 SURGICAL SUPPLY — 44 items
ADH SKN CLS APL DERMABOND .7 (GAUZE/BANDAGES/DRESSINGS) ×1
BAG RETRIEVAL 10 (BASKET) ×1
CANNULA CAP OBTURATR AIRSEAL 8 (CAP) ×2 IMPLANT
CLIP LIGATING HEM O LOK PURPLE (MISCELLANEOUS) ×2 IMPLANT
COVER TIP SHEARS 8 DVNC (MISCELLANEOUS) ×1 IMPLANT
COVER TIP SHEARS 8MM DA VINCI (MISCELLANEOUS) ×2
DERMABOND ADVANCED (GAUZE/BANDAGES/DRESSINGS) ×1
DERMABOND ADVANCED .7 DNX12 (GAUZE/BANDAGES/DRESSINGS) ×1 IMPLANT
DRAPE ARM DVNC X/XI (DISPOSABLE) ×4 IMPLANT
DRAPE COLUMN DVNC XI (DISPOSABLE) ×1 IMPLANT
DRAPE DA VINCI XI ARM (DISPOSABLE) ×8
DRAPE DA VINCI XI COLUMN (DISPOSABLE) ×2
ELECT CAUTERY BLADE 6.4 (BLADE) ×2 IMPLANT
GLOVE SURG ORTHO LTX SZ7.5 (GLOVE) ×4 IMPLANT
GOWN STRL REUS W/ TWL LRG LVL3 (GOWN DISPOSABLE) ×2 IMPLANT
GOWN STRL REUS W/ TWL XL LVL3 (GOWN DISPOSABLE) ×2 IMPLANT
GOWN STRL REUS W/TWL LRG LVL3 (GOWN DISPOSABLE) ×4
GOWN STRL REUS W/TWL XL LVL3 (GOWN DISPOSABLE) ×4
IRRIGATION STRYKERFLOW (MISCELLANEOUS) IMPLANT
IRRIGATOR STRYKERFLOW (MISCELLANEOUS) ×2
IV NS IRRIG 3000ML ARTHROMATIC (IV SOLUTION) ×1 IMPLANT
IV SODIUM CHL 0.9% 500ML (IV SOLUTION) ×2 IMPLANT
KIT PINK PAD W/HEAD ARE REST (MISCELLANEOUS) ×2 IMPLANT
KIT PINK PAD W/HEAD ARM REST (MISCELLANEOUS) ×1 IMPLANT
KIT TURNOVER KIT A (KITS) ×2 IMPLANT
LABEL OR SOLS (LABEL) ×2 IMPLANT
MANIFOLD NEPTUNE II (INSTRUMENTS) ×2 IMPLANT
NDL INSUFFLATION 14GA 120MM (NEEDLE) IMPLANT
NEEDLE HYPO 22GX1.5 SAFETY (NEEDLE) ×2 IMPLANT
NEEDLE INSUFFLATION 14GA 120MM (NEEDLE) IMPLANT
NS IRRIG 500ML POUR BTL (IV SOLUTION) ×2 IMPLANT
PACK LAP CHOLECYSTECTOMY (MISCELLANEOUS) ×2 IMPLANT
PENCIL ELECTRO HAND CTR (MISCELLANEOUS) ×2 IMPLANT
SEAL CANN UNIV 5-8 DVNC XI (MISCELLANEOUS) ×3 IMPLANT
SEAL XI 5MM-8MM UNIVERSAL (MISCELLANEOUS) ×6
SET TUBE FILTERED XL AIRSEAL (SET/KITS/TRAYS/PACK) ×2 IMPLANT
SOLUTION ELECTROLUBE (MISCELLANEOUS) ×2 IMPLANT
SPIKE FLUID TRANSFER (MISCELLANEOUS) ×2 IMPLANT
SUT MNCRL 4-0 (SUTURE) ×2
SUT MNCRL 4-0 27XMFL (SUTURE) ×1
SUT VICRYL 0 AB UR-6 (SUTURE) ×2 IMPLANT
SUTURE MNCRL 4-0 27XMF (SUTURE) ×1 IMPLANT
SYS BAG RETRIEVAL 10MM (BASKET) ×1
SYSTEM BAG RETRIEVAL 10MM (BASKET) ×1 IMPLANT

## 2022-02-03 NOTE — Assessment & Plan Note (Signed)
No prior diagnosis of diabetes but she did had hyperglycemia during COVID infection which was thought to be secondary to steroid. ?Initially received insulin infusion.  CBG improved.  A1c elevated at 7.7 ?-Continue semglee 5 units twice daily ?-SSI ? ?

## 2022-02-03 NOTE — Progress Notes (Signed)
Assumed care of pt at 1900. A&O x4. RA. Reporting on/off pain/nausea overnight. Medicated per MAR. Updated on POC w/ verbalized understanding. NPO at MN for potential Cholecystectomy 3/21. Consent form signed and in chart. View flowsheets for full assessment and vital signs.  ? ?Ambulating steadily around the room. Call bell within reach, making needs known. Safety maintained overnight. ?

## 2022-02-03 NOTE — Assessment & Plan Note (Signed)
Elevated lipase, can be due to symptomatic cholelithiasis. ?-Supportive care ?

## 2022-02-03 NOTE — Anesthesia Preprocedure Evaluation (Signed)
Anesthesia Evaluation  ?Patient identified by MRN, date of birth, ID band ?Patient awake ? ? ? ?Reviewed: ?Allergy & Precautions, NPO status , Patient's Chart, lab work & pertinent test results ? ?History of Anesthesia Complications ?Negative for: history of anesthetic complications ? ?Airway ?Mallampati: III ? ?TM Distance: >3 FB ?Neck ROM: full ? ? ? Dental ? ?(+) Chipped ?  ?Pulmonary ?neg shortness of breath, neg pneumonia , former smoker,  ?  ?Pulmonary exam normal ? ? ? ? ? ? ? Cardiovascular ?Exercise Tolerance: Good ?hypertension, (-) angina+CHF  ?(-) DOE Normal cardiovascular exam ? ? ?  ?Neuro/Psych ? Headaches, PSYCHIATRIC DISORDERS   ? GI/Hepatic ?negative GI ROS, Neg liver ROS, neg GERD  ,  ?Endo/Other  ?diabetes, Type 2 ? Renal/GU ?Renal disease  ? ?  ?Musculoskeletal ? ? Abdominal ?  ?Peds ? Hematology ?negative hematology ROS ?(+)   ?Anesthesia Other Findings ?Past Medical History: ?No date: Cardiomyopathy [facility]) ?    Comment:  a. 11/2020 Echo: EF 20-25%, no rwma, glob HK, nl RV  ?             size/fxn. ?11/2020: COVID-19 virus infection ?No date: Migraines ? ?Past Surgical History: ?12/02/2020: RIGHT/LEFT HEART CATH AND CORONARY ANGIOGRAPHY; N/A ?    Comment:  Procedure: RIGHT/LEFT HEART CATH AND CORONARY  ?             ANGIOGRAPHY;  Surgeon: Wellington Hampshire, MD;  Location:  ?             [place] [facility];  Service: Cardiovascular;   ?             Laterality: N/A; ? ?BMI   ? Body Mass Index: 30.40 kg/m?  ?  ? ? Reproductive/Obstetrics ?negative OB ROS ? ?  ? ? ? ? ? ? ? ? ? ? ? ? ? ?  ?  ? ? ? ? ? ? ? ? ?Anesthesia Physical ?Anesthesia Plan ? ?ASA: 3 ? ?Anesthesia Plan: General ETT  ? ?Post-op Pain Management:   ? ?Induction: Intravenous ? ?PONV Risk Score and Plan: Ondansetron, Dexamethasone, Midazolam and Treatment may vary due to age or medical condition ? ?Airway Management Planned: Oral ETT ? ?Additional Equipment:  ? ?Intra-op Plan:  ? ?Post-operative Plan:  Extubation in OR ? ?Informed Consent: I have reviewed the patients History and Physical, chart, labs and discussed the procedure including the risks, benefits and alternatives for the proposed anesthesia with the patient or authorized representative who has indicated his/her understanding and acceptance.  ? ? ? ?Dental Advisory Given ? ?Plan Discussed with: Anesthesiologist, CRNA and Surgeon ? ?Anesthesia Plan Comments: (Patient consented for risks of anesthesia including but not limited to:  ?- adverse reactions to medications ?- damage to eyes, teeth, lips or other oral mucosa ?- nerve damage due to positioning  ?- sore throat or hoarseness ?- Damage to heart, brain, nerves, lungs, other parts of body or loss of life ? ?Patient voiced understanding.)  ? ? ? ? ? ? ?Anesthesia Quick Evaluation ? ?

## 2022-02-03 NOTE — Transfer of Care (Signed)
Immediate Anesthesia Transfer of Care Note ? ?Patient: Krystal Gregory ? ?Procedure(s) Performed: XI ROBOTIC ASSISTED LAPAROSCOPIC CHOLECYSTECTOMY ?INDOCYANINE GREEN FLUORESCENCE IMAGING (ICG) ? ?Patient Location: PACU ? ?Anesthesia Type:General ? ?Level of Consciousness: drowsy ? ?Airway & Oxygen Therapy: Patient Spontanous Breathing and Patient connected to face mask oxygen ? ?Post-op Assessment: Report given to RN and Post -op Vital signs reviewed and stable ? ?Post vital signs: Reviewed and stable ? ?Last Vitals:  ?Vitals Value Taken Time  ?BP 103/59 02/03/22 1355  ?Temp    ?Pulse 66 02/03/22 1356  ?Resp 32 02/03/22 1356  ?SpO2 99 % 02/03/22 1356  ?Vitals shown include unvalidated device data. ? ?Last Pain:  ?Vitals:  ? 02/03/22 1043  ?TempSrc: Oral  ?PainSc: 0-No pain  ?   ? ?  ? ?Complications: No notable events documented. ?

## 2022-02-03 NOTE — Anesthesia Procedure Notes (Signed)
Procedure Name: Intubation ?Date/Time: 02/03/2022 11:45 AM ?Performed by: Daryel Gerald, RN ?Pre-anesthesia Checklist: Patient identified, Emergency Drugs available, Suction available and Patient being monitored ?Patient Re-evaluated:Patient Re-evaluated prior to induction ?Oxygen Delivery Method: Circle system utilized ?Preoxygenation: Pre-oxygenation with 100% oxygen ?Induction Type: IV induction ?Ventilation: Mask ventilation without difficulty ?Laryngoscope Size: Mac and 3 ?Grade View: Grade II ?Tube type: Oral ?Number of attempts: 1 ?Airway Equipment and Method: Stylet and Oral airway ?Placement Confirmation: ETT inserted through vocal cords under direct vision, positive ETCO2 and breath sounds checked- equal and bilateral ?Secured at: 21 cm ?Tube secured with: Tape ?Dental Injury: Teeth and Oropharynx as per pre-operative assessment  ? ? ? ? ?

## 2022-02-03 NOTE — Assessment & Plan Note (Signed)
Patient has an history of nonischemic cardiomyopathy, prior echo done last year with a EF of 20 to 25%.  On repeat echo EF improved to 40 to 45% and grade 1 diastolic dysfunction. ?She was discharged home on carvedilol, lisinopril and statin but she stopped taking them. ?-Carvedilol was resumed during current hospitalization at a higher dose of 12.5 mg twice daily. ?- lisinopril and statin were also added ?-Holding aspirin for surgery today. ?

## 2022-02-03 NOTE — Op Note (Signed)
Robotic cholecystectomy with Indocyamine Green Ductal Imaging.  ? ?Pre-operative Diagnosis: Chronic calculus cholecystitis, choledocholithiasis ? ?Post-operative Diagnosis:  Same. ? ?Procedure: Robotic assisted laparoscopic cholecystectomy with Indocyamine Green Ductal Imaging.  ? ?Surgeon: Ronny Bacon, M.D., FACS ? ?Anesthesia: General. with endotracheal tube ? ?Findings: Subacute on chronic scarring of the gallbladder neck and cystic duct.  Multiple smooth-surfaced stones. ? ? ? ? ? ? ? ? ? ? ? ? ? ?Estimated Blood Loss: 50 mL ?        ?Drains: None ?        ?Specimens: Gallbladder     ?      ?Complications: none ? ?Procedure Details  ?The patient was seen again in the Holding Room.  2.5 mg dose of ICG was administered intravenously.   ?The benefits, complications, treatment options, risks and expected outcomes were again reviewed with the patient. The likelihood of improving the patient's symptoms with return to their baseline status is good.  The patient and/or family concurred with the proposed plan, giving informed consent, again alternatives reviewed.  The patient was taken to Operating Room, identified, and the procedure verified as robotic assisted laparoscopic cholecystectomy. ? ?Prior to the induction of general anesthesia, antibiotic prophylaxis was administered. VTE prophylaxis was in place. General endotracheal anesthesia was then administered and tolerated well. The patient was positioned in the supine position.  After the induction, the abdomen was prepped with Chloraprep and draped in the sterile fashion.  ?A Time Out was held and the above information confirmed. ? ?Right infra-umbilical local infiltration with quarter percent Marcaine with epinephrine is utilized.  Made a 12 mm incision on the right periumbilical site, I advanced an optical 60m port under direct visualization into the peritoneal cavity.  Once the peritoneum was penetrated, insufflation was initiated.  The trocar was then  advanced into the abdominal cavity under direct visualization. ?Pneumoperitoneum was then continued with Air seal utilizing CO2 at 15 mmHg or less and tolerated well without any adverse changes in the patient's vital signs.  Two 8.5-mm ports were placed in the left lower quadrant and laterally, and one to the right lower quadrant, all under direct vision. All skin incisions  were infiltrated with a local anesthetic agent before making the incision and placing the trocars.  ?The patient was positioned  in reverse Trendelenburg, tilted the patient's left side down.  Da Vinci XI robot was then positioned on to the patient's left side, and docked. ? ?The gallbladder was identified, the fundus grasped via the arm 4 Prograsp and retracted cephalad. Adhesions were lysed with scissors and cautery.  The infundibulum was identified grasped and retracted laterally, exposing the peritoneum overlying the triangle of Calot. This was then opened and dissected using cautery & scissors.  Found a significant amount of dense scarring, and a markedly dilated cystic duct, meticulous dissection was performed aided by the ICG within the common bile duct.  An extended critical view of the cystic duct and cystic artery was obtained, aided by the ICG via FireFly which enabled visualization of the ductal anatomy.   ? ?The cystic duct was clearly identified and dissected to isolation.   Artery well isolated and clipped, and the cystic duct was triple clipped and divided with scissors, as close to the gallbladder neck as feasible, thus leaving two on the remaining stump.  The specimen side of the artery is sealed with bipolar and divided with monopolar scissors.  ? ?The gallbladder was taken from the gallbladder fossa in a retrograde fashion  with the electrocautery.  There was marked density of the interface between the back wall the gallbladder and its fossa.  After getting into a bit of venous bleeding in the fossa I determined to leave a  large portion of it back wall intact.  Multiple stones were spilled during the process but these were all eventually retrieved.  The residual back wall the gallbladder was then scored with cautery.  The gallbladder was removed along with the dozen or so stones and placed in an Endocatch bag.  ?The liver bed is inspected. Hemostasis was confirmed.  ?The robot was undocked and moved away from the operative field. ?5 L of normal saline irrigation was utilized and was aspirated clear.  The gallbladder and Endocatch sac were then removed through the infraumbilical port site.  ? ?Inspection of the right upper quadrant was performed.  4 additional stones were retrieved.  No bleeding, bile duct injury or leak, or bowel injury was noted. The infra-umbilical port site fascia was closed with interrumpted 0 Vicryl sutures using PMI/cone under direct visualization. Pneumoperitoneum was released and ports removed.  4-0 subcuticular Monocryl was used to close the skin. Dermabond was  applied.  The patient was then extubated and brought to the recovery room in stable condition. Sponge, lap, and needle counts were correct at closure and at the conclusion of the case.  ?        ?     ?Ronny Bacon, M.D., FACS ?02/03/2022 2:08 PM  ?

## 2022-02-03 NOTE — Progress Notes (Signed)
?Progress Note ? ? ?PatientBRISEIS Gregory GOT:[id number] DOB: 1971-06-24 DOA: 02/01/2022     2 ?DOS: the patient was seen and examined on 02/03/2022 ?  ?Brief hospital course: ?Taken from H&P and prior notes. ? ?Krystal Gregory is a 51 y.o. female with medical history significant for nonischemic cardiomyopathy, likely secondary to Covid 19, hyperglycemia with BS 619 in setting of steroids use was in USOH till 2-3 days ago when she developed onset of N/V and RUQ pain. No fevers or chills. She came in today due to inability to take po without vomiting.  Patient also has an history of nephrolithiasis. ? ?Her review of system was significant for intentional weight loss of about 50 pounds over the past year, per patient she is trying to lose weight to promote her health.  She was also experiencing polyuria and polydipsia.  No orthopnea or PND but do have exertional dyspnea. ? ?On arrival to ED she was found to be markedly hyperglycemic with blood glucose level of 619, and negative ketones and anion gap of 18.  CT abdomen shows slight dilatation of CBD but no evidence of cholecystitis.  She was also noted to have lipase of 76.  After discussing with GI, MRCP was done which shows cholelithiasis and choledocholithiasis with mild biliary ductal dilatation.  Hepatic steatosis was also noted.  Patient underwent ERCP with GI today with removal of gallstones, GI is recommending general surgery consult for possible inpatient cholecystitis.  General surgery was consulted. ? ?Patient also received insulin infusion on arrival which has been discontinued now. ? ?3/21: Patient is going for laparoscopic cholecystectomy with general surgery today. ? ? ?Assessment and Plan: ?* Choledocholithiasis ?S/p ERCP with removal of bile stones.  Patient tolerated the procedure well. ?GI is recommending general surgery evaluation for possible inpatient cholecystitis. ?Received 1 dose of ceftriaxone and Flagyl, no concern of cholangitis. ?General  surgery was consulted and she will be going for laparoscopic cholecystectomy today. ?-Follow-up for surgical recommendations. ?-Pain management ?-Zofran and Compazine for nausea and vomiting. ?-Continue with supportive care ? ?Hyperglycemia due to diabetes mellitus ([place]) ?No prior diagnosis of diabetes but she did had hyperglycemia during COVID infection which was thought to be secondary to steroid. ?Initially received insulin infusion.  CBG improved.  A1c elevated at 7.7 ?-Continue semglee 5 units twice daily ?-SSI ? ? ?Cardiomyopathy ([place]) ?Patient has an history of nonischemic cardiomyopathy, prior echo done last year with a EF of 20 to 25%.  On repeat echo EF improved to 40 to 45% and grade 1 diastolic dysfunction. ?She was discharged home on carvedilol, lisinopril and statin but she stopped taking them. ?-Carvedilol was resumed during current hospitalization at a higher dose of 12.5 mg twice daily. ?- lisinopril and statin were also added ?-Holding aspirin for surgery today. ? ?Pancreatitis ?Elevated lipase, can be due to symptomatic cholelithiasis. ?-Supportive care ? ?HTN (hypertension) ?Blood pressure within goal..  Patient was not taking her home lisinopril, and it was started yesterday. ?-Continue lisinopril at 5 mg daily-creatinine has been normalized. ?-Continue with carvedilol ?-As needed hydralazine ? ?Pyloric stenosis ?S/p dilatation by GI on 02/02/2022. ? ? ?Subjective: Patient denies any pain when seen today.  She was getting ready for her cholecystectomy, little anxious about her procedure. ? ?Physical Exam: ?Vitals:  ? 02/02/22 2034 02/03/22 0016 02/03/22 0224 02/03/22 1043  ?BP: (!) 81/51 119/63 117/72 (!) 91/50  ?Pulse: 66 61 (!) 58 66  ?Resp: '15 17 15 16  '$ ?Temp: 98.3 ?F (36.8 ?C) 98.2 ?  F (36.8 ?C) 98.2 ?F (36.8 ?C) 98.3 ?F (36.8 ?C)  ?TempSrc:    Oral  ?SpO2: 92% 98% 96% 96%  ?Weight:    90.7 kg  ?Height:    '5\' 8"'$  (1.727 m)  ? ?General.     In no acute distress. ?Pulmonary.  Lungs clear  bilaterally, normal respiratory effort. ?CV.  Regular rate and rhythm, no JVD, rub or murmur. ?Abdomen.  Soft, nontender, nondistended, BS positive. ?CNS.  Alert and oriented x3.  No focal neurologic deficit. ?Extremities.  No edema, no cyanosis, pulses intact and symmetrical. ?Psychiatry.  Judgment and insight appears normal. ? ?Data Reviewed: ?I reviewed other notes, labs and images ? ?Family Communication: Discussed with patient ? ?Disposition: ?Status is: Inpatient ?Remains inpatient appropriate because: Severity of illness.  Most likely be discharged home tomorrow if remains stable after surgery. ? ? Planned Discharge Destination: Home ? ?Time spent: 45 minutes ? ?This record has been created using Systems analyst. Errors have been sought and corrected,but may not always be located. Such creation errors do not reflect on the standard of care. ? ?Author: ?Lorella Nimrod, MD ?02/03/2022 1:52 PM ? ?For on call review [email or web address].  ?

## 2022-02-03 NOTE — Assessment & Plan Note (Signed)
S/p ERCP with removal of bile stones.  Patient tolerated the procedure well. ?GI is recommending general surgery evaluation for possible inpatient cholecystitis. ?Received 1 dose of ceftriaxone and Flagyl, no concern of cholangitis. ?General surgery was consulted and she will be going for laparoscopic cholecystectomy today. ?-Follow-up for surgical recommendations. ?-Pain management ?-Zofran and Compazine for nausea and vomiting. ?-Continue with supportive care ?

## 2022-02-03 NOTE — Assessment & Plan Note (Signed)
S/p dilatation by GI on 02/02/2022. ?

## 2022-02-03 NOTE — Progress Notes (Addendum)
Inpatient Diabetes Program Recommendations ? ?AACE/ADA: New Consensus Statement on Inpatient Glycemic Control (2015) ? ?Target Ranges:  Prepandial:   less than 140 mg/dL ?     Peak postprandial:   less than 180 mg/dL (1-2 hours) ?     Critically ill patients:  140 - 180 mg/dL  ? ? Latest Reference Range & Units 02/03/22 00:16 02/03/22 03:57 02/03/22 08:25  ?Glucose-Capillary 70 - 99 mg/dL 208 (H) 165 (H) 193 (H)  ?(H): Data is abnormally high ? Latest Reference Range & Units 02/02/22 04:03  ?Hemoglobin A1C 4.8 - 5.6 % 7.7 (H)  ?(H): Data is abnormally high ? ? ?Admit with:  ?Hyperglycemia with new onset DM ?RUQ pain/N/V and mild pancreatitis ?Significant weight loss of 50 pounds over the past year ?  ?History: Cardiomyopathy ?  ?Current Insulin Orders: Semglee 5 units BID ?        Novolog Moderate Correction Scale/ SSI (0-15 units) Q4 hours ?  ?  ?  ?NPO for Cholecystectomy today ? ?Note Semglee and Novolgo SSI Q4 hours started last PM ? ? ?Addendum 10am--Met w/ pt at bedside this AM to discuss new diagnosis of diabetes.  Pt had the covers up to her neck and look distressed as we began our conversation.  Reviewed with pt that she had issues with Hyperglycemia last year in January 2022 when she had COVID.  Pt recalled having Hyperglycemia.  I asked pt if she got the CBG meter Rx filled when she went home in Jan 2022 and pt stated "No" and that she had plenty of other things to worry about and that she figured if she ate better that her CBGs would improve on their own.  Pt then sat up in bed and held her head--I asked pt if she was OK and I asked her if I was upsetting her--pt stated "Yes" and that she is feeling overwhelmed and began to cry.  I apologized for upsetting her and told her that was not my intention-- I was simply there to share information with her so she would be aware of what was going on with her sugar levels.  Pt apologized for being upset and told me it wasn't my fault and that she is just  overwhelmed and that her health is the least of her worries right now--I asked pt if there were other issues I could help her with and pt stated something about the current president and then just stated again that she is overwhelmed and just wants to take care of her gallbladder issues first.  When pt calmed down, she again apologized and I told her to please don't worry about being upset.  I reviewed her current A1c with her, basic diabetes info with her, Basic diabetes care at home, and reminded pt that control of glucose is important to prevent complications.  According to pt, her father has diabetes.  Pt currently uninusured--Told me that they gave her info in finding a PCP when she was here in Jan 2022 but that she never was able to make an appt.  I will place a TOC consult to see if they can help her find affordable PCP care and see if they can give her a Free CBG meter as well.  Pt also educated on buying an inexpensive CBG meter OTC at [facility] if [facility] team does not have nay free meters to give.  RD consult has been ordered for diabetes diet edu and pt was ordered and given the Living well  with diabetes edu booklet (I saw the book in her pt belonging bag in her bed).  Encouraged pt to take her time and read through the book when she felt better. ?  ? ? ?--Will follow patient during hospitalization-- ? ?Wyn Quaker RN, MSN, CDE ?Diabetes Coordinator ?Inpatient Glycemic Control Team ?Team Pager: [phone] (8a-5p) ? ?

## 2022-02-03 NOTE — Assessment & Plan Note (Signed)
Blood pressure within goal..  Patient was not taking her home lisinopril, and it was started yesterday. ?-Continue lisinopril at 5 mg daily-creatinine has been normalized. ?-Continue with carvedilol ?-As needed hydralazine ?

## 2022-02-04 ENCOUNTER — Other Ambulatory Visit: Payer: Self-pay

## 2022-02-04 LAB — CBC
HCT: 37.1 % (ref 36.0–46.0)
Hemoglobin: 13 g/dL (ref 12.0–15.0)
MCH: 29 pg (ref 26.0–34.0)
MCHC: 35 g/dL (ref 30.0–36.0)
MCV: 82.8 fL (ref 80.0–100.0)
Platelets: 400 10*3/uL (ref 150–400)
RBC: 4.48 MIL/uL (ref 3.87–5.11)
RDW: 12.8 % (ref 11.5–15.5)
WBC: 20 10*3/uL — ABNORMAL HIGH (ref 4.0–10.5)
nRBC: 0 % (ref 0.0–0.2)

## 2022-02-04 LAB — GLUCOSE, CAPILLARY
Glucose-Capillary: 181 mg/dL — ABNORMAL HIGH (ref 70–99)
Glucose-Capillary: 130 mg/dL — ABNORMAL HIGH (ref 70–99)
Glucose-Capillary: 168 mg/dL — ABNORMAL HIGH (ref 70–99)

## 2022-02-04 LAB — COMPREHENSIVE METABOLIC PANEL
ALT: 113 U/L — ABNORMAL HIGH (ref 0–44)
AST: 82 U/L — ABNORMAL HIGH (ref 15–41)
Albumin: 3.1 g/dL — ABNORMAL LOW (ref 3.5–5.0)
Alkaline Phosphatase: 89 U/L (ref 38–126)
Anion gap: 4 — ABNORMAL LOW (ref 5–15)
BUN: 17 mg/dL (ref 6–20)
CO2: 27 mmol/L (ref 22–32)
Calcium: 8.3 mg/dL — ABNORMAL LOW (ref 8.9–10.3)
Chloride: 105 mmol/L (ref 98–111)
Creatinine, Ser: 0.82 mg/dL (ref 0.44–1.00)
GFR, Estimated: >60 mL/min (ref >60)
Glucose, Bld: 146 mg/dL — ABNORMAL HIGH (ref 70–99)
Potassium: 3.8 mmol/L (ref 3.5–5.1)
Sodium: 136 mmol/L (ref 135–145)
Total Bilirubin: 0.8 mg/dL (ref 0.3–1.2)
Total Protein: 5.7 g/dL — ABNORMAL LOW (ref 6.5–8.1)

## 2022-02-04 MED ORDER — METFORMIN HCL ER 500 MG PO TB24
500.0000 mg | ORAL_TABLET | Freq: Two times a day (BID) | ORAL | 1 refills | Status: AC
  Filled 2022-02-04: qty 60, 30d supply, fill #0

## 2022-02-04 MED ORDER — LISINOPRIL 5 MG PO TABS
5.0000 mg | ORAL_TABLET | Freq: Every day | ORAL | 2 refills | Status: AC
  Filled 2022-02-04: qty 30, 30d supply, fill #0

## 2022-02-04 MED ORDER — ATORVASTATIN CALCIUM 40 MG PO TABS
40.0000 mg | ORAL_TABLET | Freq: Every day | ORAL | 3 refills | Status: AC
  Filled 2022-02-04: qty 30, 30d supply, fill #0

## 2022-02-04 MED ORDER — RIGHTEST GS550 BLOOD GLUCOSE VI STRP
ORAL_STRIP | 0 refills | Status: AC
  Filled 2022-02-04: qty 100, 25d supply, fill #0

## 2022-02-04 MED ORDER — BLOOD GLUCOSE MONITOR KIT
PACK | 0 refills | Status: AC
  Filled 2022-02-04: qty 1, 30d supply, fill #0

## 2022-02-04 MED ORDER — MUPIROCIN 2 % EX OINT
1.0000 | TOPICAL_OINTMENT | Freq: Two times a day (BID) | CUTANEOUS | 0 refills | Status: AC
Start: 2022-02-04 — End: ?
  Filled 2022-02-04: qty 22, 11d supply, fill #0

## 2022-02-04 MED ORDER — CARVEDILOL 12.5 MG PO TABS
12.5000 mg | ORAL_TABLET | Freq: Two times a day (BID) | ORAL | 1 refills | Status: AC
  Filled 2022-02-04: qty 60, 30d supply, fill #0

## 2022-02-04 MED ORDER — MELATONIN 5 MG PO TABS
2.5000 mg | ORAL_TABLET | Freq: Every evening | ORAL | Status: DC | PRN
  Administered 2022-02-04: 2.5 mg via ORAL
  Filled 2022-02-04: qty 1

## 2022-02-04 MED ORDER — RIGHTEST GL300 LANCETS MISC
0 refills | Status: AC
  Filled 2022-02-04: qty 100, 25d supply, fill #0

## 2022-02-04 MED ORDER — MUSCLE RUB 10-15 % EX CREA
1.0000 "application " | TOPICAL_CREAM | CUTANEOUS | 0 refills | Status: AC | PRN
  Filled 2022-02-04: qty 35, fill #0

## 2022-02-04 MED ORDER — ASPIRIN 81 MG PO TBEC
81.0000 mg | DELAYED_RELEASE_TABLET | Freq: Every day | ORAL | 11 refills | Status: AC
  Filled 2022-02-04: qty 30, 30d supply, fill #0

## 2022-02-04 MED ORDER — RIGHTEST GL300 LANCETS MISC
0 refills | Status: DC
  Filled 2022-02-04: qty 100, 25d supply, fill #0

## 2022-02-04 NOTE — TOC Transition Note (Signed)
Transition of Care (TOC) - CM/SW Discharge Note ? ? ?Patient Details  ?Name: Krystal Gregory ?MRN: [id number] ?Date of Birth: 12-21-1970 ? ?Transition of Care (TOC) CM/SW Contact:  ?Alberteen Sam, LCSW ?Phone Number: ?02/04/2022, 12:01 PM ? ? ?Clinical Narrative:    ? ?Patient to dc home today, meds sent to med management and patient is set up at [facility] with apt on April 13th at 1pm with Dr. Hendricks Milo. ? ?No further dc needs identified. ? ?Final next level of care: Home/Self Care ?Barriers to Discharge: No Barriers Identified ? ? ?Patient Goals and CMS Choice ?Patient states their goals for this hospitalization and ongoing recovery are:: to go home ?CMS Medicare.gov Compare Post Acute Care list provided to:: Patient ?Choice offered to / list presented to : Patient ? ?Discharge Placement ?  ?           ?  ?  ?  ?  ? ?Discharge Plan and Services ?  ?  ?           ?  ?  ?  ?  ?  ?  ?  ?  ?  ?  ? ?Social Determinants of Health (SDOH) Interventions ?  ? ? ?Readmission Risk Interventions ?   ? View : No data to display.  ?  ?  ?  ? ? ? ? ? ?

## 2022-02-04 NOTE — Anesthesia Postprocedure Evaluation (Signed)
Anesthesia Post Note ? ?Patient: Olanna Percifield Goers ? ?Procedure(s) Performed: XI ROBOTIC ASSISTED LAPAROSCOPIC CHOLECYSTECTOMY ?INDOCYANINE GREEN FLUORESCENCE IMAGING (ICG) ? ?Patient location during evaluation: PACU ?Anesthesia Type: General ?Level of consciousness: awake and alert ?Pain management: pain level controlled ?Vital Signs Assessment: post-procedure vital signs reviewed and stable ?Respiratory status: spontaneous breathing, nonlabored ventilation, respiratory function stable and patient connected to nasal cannula oxygen ?Cardiovascular status: blood pressure returned to baseline and stable ?Postop Assessment: no apparent nausea or vomiting ?Anesthetic complications: no ? ? ?No notable events documented. ? ? ?Last Vitals:  ?Vitals:  ? 02/04/22 0032 02/04/22 0342  ?BP: (!) 88/48 (!) 103/54  ?Pulse: 69 63  ?Resp: 16 16  ?Temp: 36.5 ?C 36.6 ?C  ?SpO2: 98% 96%  ?  ?Last Pain:  ?Vitals:  ? 02/04/22 0342  ?TempSrc: Oral  ?PainSc: 0-No pain  ? ? ?  ?  ?  ?  ?  ?  ? ?Arita Miss ? ? ? ? ?

## 2022-02-04 NOTE — Progress Notes (Signed)
Assumed care of pt at 1900. A&O x4. RA. C/o acute abdominal pain overnight r/t surgery. Four lap sites C/D/I. Medication administration per MAR. Full assessment and vitals as documented. UA collected and sent this shift. Call bell within reach, making needs known. Comfort and safety maintained.  ?

## 2022-02-04 NOTE — Progress Notes (Signed)
Inpatient Diabetes Program Recommendations ? ?AACE/ADA: New Consensus Statement on Inpatient Glycemic Control (2015) ? ?Target Ranges:  Prepandial:   less than 140 mg/dL ?     Peak postprandial:   less than 180 mg/dL (1-2 hours) ?     Critically ill patients:  140 - 180 mg/dL  ? ?Lab Results  ?Component Value Date  ? GLUCAP 168 (H) 02/04/2022  ? HGBA1C 7.7 (H) 02/02/2022  ? ? ?Followed up with pt and answered questions related to new diagnosis of DM. Discussed when to take medications and encouraged close follow up. ? ?Thanks, ? ?Tama Headings RN, MSN, BC-ADM ?Inpatient Diabetes Coordinator ?Team Pager [phone] (8a-5p) ? ? ?

## 2022-02-04 NOTE — Progress Notes (Signed)
[facility] ?SURGICAL PROGRESS NOTE ? ?Hospital Day(s): 3.  ? ?Post op day(s): 1 Day Post-Op.  ? ?Interval History:  ?Patient seen and examined ?No acute events or new complaints overnight.  ?Patient reports she is feeling good this morning ?Minimal abdominal soreness ?No fever, chills, nausea, emesis ?She did have a bump in leukocytosis overnight; 20.0K; this is likely attributable to surgery ?Renal function remains normal; scr - 0.82; 300 ccs + unmeasured x3 ?No significant electrolyte derangements  ?Total bilirubin level is normal at 0.8 ?She continues on Rocephin and Flagyl  ?Diet has been advanced; having bowel movements  ? ?Vital signs in last 24 hours: [min-max] current  ?Temp:  [97 ?F (36.1 ?C)-98.3 ?F (36.8 ?C)] 97.9 ?F (36.6 ?C) (03/22 0342) ?Pulse Rate:  [62-80] 63 (03/22 0342) ?Resp:  [6-19] 16 (03/22 0342) ?BP: (81-103)/(48-59) 103/54 (03/22 0342) ?SpO2:  [95 %-100 %] 96 % (03/22 0342) ?Weight:  [90.7 kg] 90.7 kg (03/21 1043)     Height: '5\' 8"'$  (172.7 cm) Weight: 90.7 kg BMI (Calculated): 30.41  ? ?Intake/Output last 2 shifts:  ?03/21 0701 - 03/22 0700 ?In: 2041.4 [P.O.:600; I.V.:1341.4; IV Piggyback:100] ?Out: 310 [Urine:300; Blood:10]  ? ?Physical Exam:  ?Constitutional: alert, cooperative and no distress  ?Respiratory: breathing non-labored at rest  ?Cardiovascular: regular rate and sinus rhythm  ?Gastrointestinal: soft, non-tender, and non-distended; no rebound/guarding ?Integumentary: Laparoscopic incisions are CDI, no erythema or drainage  ? ?Labs:  ?CBC Latest Ref Rng & Units 02/04/2022 02/02/2022 02/01/2022  ?WBC 4.0 - 10.5 K/uL 20.0(H) 14.6(H) 19.1(H)  ?Hemoglobin 12.0 - 15.0 g/dL 13.0 13.3 17.6(H)  ?Hematocrit 36.0 - 46.0 % 37.1 37.5 48.3(H)  ?Platelets 150 - 400 K/uL 400 473(H) 698(H)  ? ?CMP Latest Ref Rng & Units 02/04/2022 02/02/2022 02/01/2022  ?Glucose 70 - 99 mg/dL 146(H) 187(H) 613(HH)  ?BUN 6 - 20 mg/dL 17 34(H) 70(H)  ?Creatinine 0.44 - 1.00 mg/dL 0.82 0.92 1.91(H)  ?Sodium  135 - 145 mmol/L 136 132(L) 121(L)  ?Potassium 3.5 - 5.1 mmol/L 3.8 3.4(L) 3.5  ?Chloride 98 - 111 mmol/L 105 98 82(L)  ?CO2 22 - 32 mmol/L 27 25 21(L)  ?Calcium 8.9 - 10.3 mg/dL 8.3(L) 8.4(L) 10.1  ?Total Protein 6.5 - 8.1 g/dL 5.7(L) 6.7 9.0(H)  ?Total Bilirubin 0.3 - 1.2 mg/dL 0.8 1.0 1.3(H)  ?Alkaline Phos 38 - 126 U/L 89 64 87  ?AST 15 - 41 U/L 82(H) 27 33  ?ALT 0 - 44 U/L 113(H) 31 41  ? ? ? ?Imaging studies: No new pertinent imaging studies ? ? ?Assessment/Plan:  ?51 y.o. female 1 Day Post-Op s/p robotic assisted laparoscopic cholecystectomy for choledocholithiasis s/p ERCP on 03/20.  ? ? - Okay to continue diet as tolerated; recommend low fat diet for few days post-operatively ? - Monitor abdominal examination; on-going bowel function ?- Pain control prn; antiemetics prn  ? - Mobilization as tolerated; reviewed post-op restrictions  ? - Further management per primary service  ? ? - Discharge Planning: Okay for discharge form surgical perspective, updated discharge instructions and reviewed with patient; follow up placed for 2 weeks, we will be available   ? ?All of the above findings and recommendations were discussed with the patient, and the medical team, and all of patient's questions were answered to her expressed satisfaction. ? ?-- ?Edison Simon, PA-C ?[facility] ?02/04/2022, 7:48 AM ?[phone] ?M-F: 7am - 4pm ? ?

## 2022-02-04 NOTE — Discharge Summary (Signed)
?Physician Discharge Summary ?  ?Patient: Krystal Gregory MRN: [id number] DOB: 1970-12-17  ?Admit date:     02/01/2022  ?Discharge date: 02/04/22  ?Discharge Physician: Lorella Nimrod  ? ?PCP: Vania Rea, MD  ? ?Recommendations at discharge:  ?Please obtain CBC and CMP in 1 week ?Follow-up with general surgery in 1 to 2 weeks ?Follow-up with primary care provider in 1 week ?Follow-up with cardiology ? ?Discharge Diagnoses: ?Principal Problem: ?  Choledocholithiasis ?Active Problems: ?  Hyperglycemia due to diabetes mellitus ([place]) ?  Cardiomyopathy ([place]) ?  Pancreatitis ?  HTN (hypertension) ?  Pyloric stenosis ?  History of kidney stones ?  Hydronephrosis ? ?Hospital Course: ?Taken from H&P and prior notes. ? ?Krystal Gregory is a 51 y.o. female with medical history significant for nonischemic cardiomyopathy, likely secondary to Covid 19, hyperglycemia with BS 619 in setting of steroids use was in USOH till 2-3 days ago when she developed onset of N/V and RUQ pain. No fevers or chills. She came in today due to inability to take po without vomiting.  Patient also has an history of nephrolithiasis. ? ?Her review of system was significant for intentional weight loss of about 50 pounds over the past year, per patient she is trying to lose weight to promote her health.  She was also experiencing polyuria and polydipsia.  No orthopnea or PND but do have exertional dyspnea. ? ?On arrival to ED she was found to be markedly hyperglycemic with blood glucose level of 619, and negative ketones and anion gap of 18.  CT abdomen shows slight dilatation of CBD but no evidence of cholecystitis.  She was also noted to have lipase of 76.  After discussing with GI, MRCP was done which shows cholelithiasis and choledocholithiasis with mild biliary ductal dilatation.  Hepatic steatosis was also noted.  Patient underwent ERCP with GI today with removal of gallstones, GI is recommending general surgery consult for possible inpatient  cholecystitis.  General surgery was consulted. ? ?On EGD she was also found to have pyloric stenosis which was dilated by GI on 02/02/2022. ? ?Patient also received insulin infusion on arrival which has been discontinued now. ? ?Patient underwent laparoscopic cholecystectomy with general surgery on 02/03/2022 and tolerated the procedure well.  She was feeling much improved and not much pain.  She does not want narcotics for pain and can use Tylenol and Motrin as needed. ?She will follow-up with general surgery in 1 to 2 weeks. ? ?Patient was also started on metformin for her new diagnosis of diabetes.  She does not have insurance but need to see a PCP regularly for further titration and monitoring.  A1c of 7.7. ?Diabetic education was provided. ? ?As patient stopped taking all of her medications, we resumed her home aspirin and Crestor. ?We increased the dose of carvedilol and lisinopril for better control of her blood pressure. ?Repeat echocardiogram with some improvement of EF to 40 to 45% with grade 1 diastolic dysfunction.  She needs a close follow-up with cardiology for further recommendations. ? ?Patient needs to continue with current medications and follow-up closely with her providers for further recommendations. ? ?Assessment and Plan: ?* Choledocholithiasis ?S/p ERCP with removal of bile stones.  Patient tolerated the procedure well. ?GI is recommending general surgery evaluation for possible inpatient cholecystitis. ?Received 1 dose of ceftriaxone and Flagyl, no concern of cholangitis. ?General surgery was consulted and she will be going for laparoscopic cholecystectomy today. ?-Follow-up for surgical recommendations. ?-Pain management ?-Zofran and Compazine for  nausea and vomiting. ?-Continue with supportive care ? ?Hyperglycemia due to diabetes mellitus ([place]) ?No prior diagnosis of diabetes but she did had hyperglycemia during COVID infection which was thought to be secondary to steroid. ?Initially  received insulin infusion.  CBG improved.  A1c elevated at 7.7 ?-Continue semglee 5 units twice daily ?-SSI ? ? ?Cardiomyopathy ([place]) ?Patient has an history of nonischemic cardiomyopathy, prior echo done last year with a EF of 20 to 25%.  On repeat echo EF improved to 40 to 45% and grade 1 diastolic dysfunction. ?She was discharged home on carvedilol, lisinopril and statin but she stopped taking them. ?-Carvedilol was resumed during current hospitalization at a higher dose of 12.5 mg twice daily. ?- lisinopril and statin were also added ?-Holding aspirin for surgery today. ? ?Pancreatitis ?Elevated lipase, can be due to symptomatic cholelithiasis. ?-Supportive care ? ?HTN (hypertension) ?Blood pressure within goal..  Patient was not taking her home lisinopril, and it was started yesterday. ?-Continue lisinopril at 5 mg daily-creatinine has been normalized. ?-Continue with carvedilol ?-As needed hydralazine ? ?Pyloric stenosis ?S/p dilatation by GI on 02/02/2022. ? ? ?Consultants: Gastroenterology, general surgery ?Procedures performed: ERCP, laparoscopic cholecystectomy. ?Disposition: Home ?Diet recommendation:  ?Discharge Diet Orders (From admission, onward)  ? ?  Start     Ordered  ? 02/04/22 0000  Diet - low sodium heart healthy       ? 02/04/22 1139  ? ?  ?  ? ?  ? ?Cardiac and Carb modified diet ?DISCHARGE MEDICATION: ?Allergies as of 02/04/2022   ? ?   Reactions  ? Penicillins Rash  ? Tolerates cephalosporins  ? ?  ? ?  ?Medication List  ?  ? ?STOP taking these medications   ? ?promethazine 12.5 MG tablet ?Commonly known as: PHENERGAN ?  ? ?  ? ?TAKE these medications   ? ?acetaminophen 500 MG tablet ?Commonly known as: TYLENOL ?Take 1,000 mg by mouth every 6 (six) hours as needed. ?  ?albuterol 108 (90 Base) MCG/ACT inhaler ?Commonly known as: VENTOLIN HFA ?Inhale 2 puffs into the lungs every 4 (four) hours as needed for wheezing or shortness of breath. ?  ?aspirin 81 MG EC tablet ?Take 1 tablet (81 mg total)  by mouth daily. Swallow whole. ?  ?atorvastatin 40 MG tablet ?Commonly known as: LIPITOR ?Take 1 tablet (40 mg total) by mouth daily. ?  ?blood glucose meter kit and supplies Kit ?Dispense based on patient and insurance preference. Use up to four times daily as directed. ?What changed: additional instructions ?  ?carvedilol 12.5 MG tablet ?Commonly known as: COREG ?Take 1 tablet (12.5 mg total) by mouth 2 (two) times daily with a meal. ?What changed:  ?medication strength ?how much to take ?  ?GOODYS BODY PAIN PO ?Take 1 packet by mouth as needed. ?  ?ibuprofen 200 MG tablet ?Commonly known as: ADVIL ?Take 400 mg by mouth every 6 (six) hours as needed. ?  ?lisinopril 5 MG tablet ?Commonly known as: ZESTRIL ?Take 1 tablet (5 mg total) by mouth daily. ?Start taking on: February 05, 2022 ?What changed:  ?medication strength ?how much to take ?  ?metFORMIN 500 MG 24 hr tablet ?Commonly known as: GLUCOPHAGE-XR ?Take 1 tablet (500 mg total) by mouth 2 (two) times daily. ?  ?mupirocin ointment 2 % ?Commonly known as: BACTROBAN ?Place 1 application. into the nose 2 (two) times daily. ?  ?Muscle Rub 10-15 % Crea ?Apply 1 application. topically as needed for muscle pain. ?  ?SUMAtriptan 100  MG tablet ?Commonly known as: IMITREX ?Take 100 mg by mouth every 2 (two) hours as needed for migraine. May repeat in 2 hours if headache persists or recurs. ?  ? ?  ? ?  ?  ? ? ?  ?Discharge Care Instructions  ?(From admission, onward)  ?  ? ? ?  ? ?  Start     Ordered  ? 02/04/22 0000  No dressing needed       ? 02/04/22 1139  ? ?  ?  ? ?  ? ? Follow-up Information   ? ? Ronny Bacon, MD. Schedule an appointment as soon as possible for a visit in 2 week(s).   ?Specialty: General Surgery ?Why: s/p robotic assisted laparoscopic cholecystectomy ?Contact information: ?[address] ?[address] ?[place] [address] ?[phone] ? ? ?  ?  ? ? Vania Rea, MD. Schedule an appointment as soon as possible for a visit in 1 week(s).    ?Specialty: Obstetrics and Gynecology ?Contact information: ?[address] ?[address] ?[place] [address] ?[phone] ? ? ?  ?  ? ? Wellington Hampshire, MD .   ?Specialty: Cardiology ?Contact information: ?[id number]

## 2022-02-04 NOTE — Discharge Instructions (Signed)
In addition to included general post-operative instructions, ? ?Diet: Resume home diet. Recommend avoiding or limiting fatty/greasy foods over the next few days/week. If you do eat these, you may (or may not) notice diarrhea. This is expected while your body adjusts to not having a gallbladder, and it typically resolves with time.   ? ?Activity: No heavy lifting >20 pounds (children, pets, laundry, garbage) for 4 weeks, but light activity and walking are encouraged. Do not drive or drink alcohol if taking narcotic pain medications or having pain that might distract from driving. ? ?Wound care: 2 days after surgery (03/23), you may shower/get incision wet with soapy water and pat dry (do not rub incisions), but no baths or submerging incision underwater until follow-up.  ? ?Medications: Resume all home medications. For mild to moderate pain: acetaminophen (Tylenol) or ibuprofen/naproxen (if no kidney disease). Combining Tylenol with alcohol can substantially increase your risk of causing liver disease. Narcotic pain medications, if prescribed, can be used for severe pain, though may cause nausea, constipation, and drowsiness. Do not combine Tylenol and Percocet (or similar) within a 6 hour period as Percocet (and similar) contain(s) Tylenol. If you do not need the narcotic pain medication, you do not need to fill the prescription. ? ?Call office [phone] / [phone]) at any time if any questions, worsening pain, fevers/chills, bleeding, drainage from incision site, or other concerns. ? ?

## 2022-02-05 LAB — SURGICAL PATHOLOGY

## 2022-02-06 LAB — CULTURE, BLOOD (ROUTINE X 2)
Culture: NO GROWTH
Culture: NO GROWTH
Special Requests: ADEQUATE

## 2022-05-15 ENCOUNTER — Emergency Department
Admission: EM | Admit: 2022-05-15 | Discharge: 2022-05-15 | Disposition: A | Discharge status: Home / Self Care | Payer: Self-pay | Attending: Emergency Medicine | Admitting: Emergency Medicine

## 2022-05-15 ENCOUNTER — Emergency Department: Payer: Self-pay

## 2022-05-15 DIAGNOSIS — R112 Nausea with vomiting, unspecified: Secondary | ICD-10-CM | POA: Insufficient documentation

## 2022-05-15 DIAGNOSIS — R739 Hyperglycemia, unspecified: Secondary | ICD-10-CM | POA: Insufficient documentation

## 2022-05-15 DIAGNOSIS — R7309 Other abnormal glucose: Secondary | ICD-10-CM

## 2022-05-15 DIAGNOSIS — R000 Tachycardia, unspecified: Secondary | ICD-10-CM

## 2022-05-15 DIAGNOSIS — D72829 Elevated white blood cell count, unspecified: Secondary | ICD-10-CM | POA: Insufficient documentation

## 2022-05-15 LAB — BASIC METABOLIC PANEL
Anion Gap: 14 (ref 5.0–15.0)
BUN: 10 mg/dL (ref 7.0–21.0)
CO2: 24 mEq/L (ref 17–29)
Calcium: 10.9 mg/dL — ABNORMAL HIGH (ref 8.5–10.5)
Chloride: 98 mEq/L — ABNORMAL LOW (ref 99–111)
Creatinine: 0.9 mg/dL (ref 0.4–1.0)
Glucose: 227 mg/dL — ABNORMAL HIGH (ref 70–100)
Potassium: 3.7 mEq/L (ref 3.5–5.3)
Sodium: 136 mEq/L (ref 135–145)
eGFR: >60 mL/min/{1.73_m2} (ref >=60)

## 2022-05-15 LAB — CBC AND DIFFERENTIAL
Absolute NRBC: 0 10*3/uL (ref 0.00–0.00)
Absolute NRBC: 0 10*3/uL (ref 0.00–0.00)
Basophils Absolute Automated: 0.07 10*3/uL (ref 0.00–0.08)
Basophils Absolute Automated: 0.05 10*3/uL (ref 0.00–0.08)
Basophils Automated: 0.3 %
Basophils Automated: 0.3 %
Eosinophils Absolute Automated: 0.01 10*3/uL (ref 0.00–0.44)
Eosinophils Absolute Automated: 0.02 10*3/uL (ref 0.00–0.44)
Eosinophils Automated: 0 %
Eosinophils Automated: 0.1 %
Hematocrit: 50.7 % — ABNORMAL HIGH (ref 34.7–43.7)
Hematocrit: 44.8 % — ABNORMAL HIGH (ref 34.7–43.7)
Hgb: 17.9 g/dL — ABNORMAL HIGH (ref 11.4–14.8)
Hgb: 15.4 g/dL — ABNORMAL HIGH (ref 11.4–14.8)
Immature Granulocytes Absolute: 0.07 10*3/uL (ref 0.00–0.07)
Immature Granulocytes Absolute: 0.07 10*3/uL (ref 0.00–0.07)
Immature Granulocytes: 0.3 %
Immature Granulocytes: 0.4 %
Instrument Absolute Neutrophil Count: 17.07 10*3/uL — ABNORMAL HIGH (ref 1.10–6.33)
Instrument Absolute Neutrophil Count: 14.47 10*3/uL — ABNORMAL HIGH (ref 1.10–6.33)
Lymphocytes Absolute Automated: 2.36 10*3/uL (ref 0.42–3.22)
Lymphocytes Absolute Automated: 2.05 10*3/uL (ref 0.42–3.22)
Lymphocytes Automated: 11.3 %
Lymphocytes Automated: 11.5 %
MCH: 29.5 pg (ref 25.1–33.5)
MCH: 29.6 pg (ref 25.1–33.5)
MCHC: 35.3 g/dL (ref 31.5–35.8)
MCHC: 34.4 g/dL (ref 31.5–35.8)
MCV: 83.5 fL (ref 78.0–96.0)
MCV: 86.2 fL (ref 78.0–96.0)
MPV: 9.2 fL (ref 8.9–12.5)
MPV: 9.3 fL (ref 8.9–12.5)
Monocytes Absolute Automated: 1.33 10*3/uL — ABNORMAL HIGH (ref 0.21–0.85)
Monocytes Absolute Automated: 1.16 10*3/uL — ABNORMAL HIGH (ref 0.21–0.85)
Monocytes: 6.4 %
Monocytes: 6.5 %
Neutrophils Absolute: 17.07 10*3/uL — ABNORMAL HIGH (ref 1.10–6.33)
Neutrophils Absolute: 14.47 10*3/uL — ABNORMAL HIGH (ref 1.10–6.33)
Neutrophils: 81.7 %
Neutrophils: 81.2 %
Nucleated RBC: 0 /100 WBC (ref 0.0–0.0)
Nucleated RBC: 0 /100 WBC (ref 0.0–0.0)
Platelets: 611 10*3/uL — ABNORMAL HIGH (ref 142–346)
Platelets: 521 10*3/uL — ABNORMAL HIGH (ref 142–346)
RBC: 6.07 10*6/uL — ABNORMAL HIGH (ref 3.90–5.10)
RBC: 5.2 10*6/uL — ABNORMAL HIGH (ref 3.90–5.10)
RDW: 12 % (ref 11–15)
RDW: 13 % (ref 11–15)
WBC: 20.91 10*3/uL — ABNORMAL HIGH (ref 3.10–9.50)
WBC: 17.82 10*3/uL — ABNORMAL HIGH (ref 3.10–9.50)

## 2022-05-15 LAB — URINALYSIS REFLEX TO MICROSCOPIC EXAM - REFLEX TO CULTURE
Bilirubin, UA: NEGATIVE
Blood, UA: NEGATIVE
Ketones UA: NEGATIVE
Leukocyte Esterase, UA: NEGATIVE
Nitrite, UA: NEGATIVE
Specific Gravity UA: 1.02 (ref 1.001–1.035)
Urine pH: 6 (ref 5.0–8.0)
Urobilinogen, UA: NORMAL mg/dL (ref 0.2–2.0)

## 2022-05-15 LAB — HEPATIC FUNCTION PANEL
ALT: 30 U/L (ref 0–55)
AST (SGOT): 22 U/L (ref 5–41)
Albumin/Globulin Ratio: 1.2 (ref 0.9–2.2)
Albumin: 4.4 g/dL (ref 3.5–5.0)
Alkaline Phosphatase: 83 U/L (ref 37–117)
Bilirubin Direct: 0.2 mg/dL (ref 0.0–0.5)
Bilirubin Indirect: 0.5 mg/dL (ref 0.2–1.0)
Bilirubin, Total: 0.7 mg/dL (ref 0.2–1.2)
Globulin: 3.6 g/dL (ref 2.0–3.6)
Protein, Total: 8 g/dL (ref 6.0–8.3)

## 2022-05-15 LAB — ECG 12-LEAD
Atrial Rate: 101 {beats}/min
P Axis: 56 degrees
P-R Interval: 144 ms
Q-T Interval: 366 ms
QRS Duration: 80 ms
QTC Calculation (Bezet): 474 ms
R Axis: -5 degrees
T Axis: 30 degrees
Ventricular Rate: 101 {beats}/min

## 2022-05-15 LAB — LACTIC ACID, PLASMA
Lactic Acid: 3.2 mmol/L — ABNORMAL HIGH (ref 0.2–2.0)
Lactic Acid: 2.9 mmol/L — ABNORMAL HIGH (ref 0.2–2.0)

## 2022-05-15 LAB — MAGNESIUM: Magnesium: 2.3 mg/dL (ref 1.6–2.6)

## 2022-05-15 LAB — LIPASE: Lipase: 88 U/L — ABNORMAL HIGH (ref 8–78)

## 2022-05-15 LAB — I-STAT WHOLE BLOOD B-HCG, TOTAL: i-stat Whole Blood B-HCG, Total: <5 IU/L

## 2022-05-15 MED ORDER — ONDANSETRON HCL 4 MG/2ML IJ SOLN
4.0000 mg | Freq: Once | INTRAMUSCULAR | Status: AC
Start: 2022-05-15 — End: 2022-05-15

## 2022-05-15 MED ORDER — STERILE WATER FOR INJECTION IJ/IV SOLN (WRAP)
4.5000 g | Freq: Once | INTRAVENOUS | Status: AC
Start: 2022-05-15 — End: 2022-05-15
  Administered 2022-05-15: 4.5 g via INTRAVENOUS
  Filled 2022-05-15: qty 20

## 2022-05-15 MED ORDER — ONDANSETRON HCL 4 MG/2ML IJ SOLN
INTRAMUSCULAR | Status: AC
Start: 2022-05-15 — End: 2022-05-15
  Administered 2022-05-15: 4 mg via INTRAVENOUS
  Filled 2022-05-15: qty 2

## 2022-05-15 MED ORDER — SODIUM CHLORIDE 0.9 % IV BOLUS
1000.0000 mL | Freq: Once | INTRAVENOUS | Status: AC
Start: 2022-05-15 — End: 2022-05-15
  Administered 2022-05-15: 1000 mL via INTRAVENOUS

## 2022-05-15 MED ORDER — MORPHINE SULFATE 4 MG/ML IJ/IV SOLN (WRAP)
4.0000 mg | Freq: Once | Status: AC
Start: 2022-05-15 — End: 2022-05-15
  Administered 2022-05-15: 4 mg via INTRAVENOUS
  Filled 2022-05-15: qty 1

## 2022-05-15 MED ORDER — ONDANSETRON 4 MG PO TBDP
4.0000 mg | ORAL_TABLET | Freq: Four times a day (QID) | ORAL | 0 refills | Status: AC | PRN
Start: 2022-05-15 — End: ?

## 2022-05-15 MED ORDER — ONDANSETRON 4 MG PO TBDP
4.0000 mg | ORAL_TABLET | Freq: Four times a day (QID) | ORAL | 0 refills | Status: DC | PRN
Start: 2022-05-15 — End: 2022-05-15

## 2022-05-15 MED ORDER — IOHEXOL 350 MG/ML IV SOLN
100.0000 mL | Freq: Once | INTRAVENOUS | Status: AC | PRN
Start: 2022-05-15 — End: 2022-05-15
  Administered 2022-05-15: 100 mL via INTRAVENOUS

## 2022-05-15 MED ORDER — METOCLOPRAMIDE HCL 5 MG/ML IJ SOLN
10.0000 mg | Freq: Once | INTRAMUSCULAR | Status: AC
Start: 2022-05-15 — End: 2022-05-15
  Administered 2022-05-15: 10 mg via INTRAVENOUS
  Filled 2022-05-15: qty 2

## 2022-05-15 MED ORDER — ONDANSETRON HCL 4 MG/2ML IJ SOLN
4.0000 mg | Freq: Once | INTRAMUSCULAR | Status: AC
Start: 2022-05-15 — End: 2022-05-15
  Administered 2022-05-15: 4 mg via INTRAVENOUS
  Filled 2022-05-15: qty 2

## 2022-05-15 NOTE — ED Provider Notes (Signed)
History     Chief Complaint   Patient presents with    Abdominal Pain    Emesis    Numbness     Alisha Lloyd is a 51 y.o. female history of cholecystectomy who presents with abdominal pain and vomiting.  Patient reports the pain is generalized.  She may have initially had some right lower quadrant pain but now it is equal throughout her abdomen.  She has associated nonbloody vomiting and is persistently nauseated.  Symptoms started yesterday around 5 PM and have slowly and progressively gotten worse.  She reports no associated change in stools and no dysuria or hematuria.  No flank pain.  She feels chills but no clear fever.  No abdominal distention.  She also reports that today after several heavy bouts of vomiting that she has tingling in the tips of her fingers and toes.  No other numbness or weakness, no vision changes, no hearing changes, no facial droop, no difficulty with ambulation.        Abdominal Pain  Associated symptoms: chills, nausea and vomiting    Associated symptoms: no chest pain, no cough, no diarrhea, no dysuria, no fever, no hematuria and no shortness of breath    Emesis  Associated symptoms: abdominal pain and chills    Associated symptoms: no cough, no diarrhea, no fever and no myalgias         No past medical history on file.    No past surgical history on file.    No family history on file.    Social       .     No Known Allergies    Home Medications    None on File          Review of Systems   Constitutional:  Positive for chills. Negative for fever.   Eyes:  Negative for visual disturbance.   Respiratory:  Negative for cough, chest tightness and shortness of breath.    Cardiovascular:  Negative for chest pain.   Gastrointestinal:  Positive for abdominal pain, nausea and vomiting. Negative for abdominal distention, blood in stool and diarrhea.   Genitourinary:  Negative for dysuria, hematuria and pelvic pain.   Musculoskeletal:  Negative for back pain, myalgias and neck pain.    Skin:  Negative for rash and wound.   Allergic/Immunologic: Negative for immunocompromised state.   Neurological:  Negative for weakness and light-headedness.   Hematological:  Does not bruise/bleed easily.   Psychiatric/Behavioral:  Negative for confusion and decreased concentration.        Physical Exam    BP: 130/70, Heart Rate: (!) 108, Temp: 97.6 F (36.4 C), Resp Rate: 18, SpO2: 96 %    Physical Exam  Constitutional:       Comments: Uncomfortable, vomiting   HENT:      Head: Normocephalic and atraumatic.      Mouth/Throat:      Pharynx: Oropharynx is clear. No oropharyngeal exudate.   Cardiovascular:      Rate and Rhythm: Regular rhythm. Tachycardia present.   Pulmonary:      Effort: Pulmonary effort is normal. No respiratory distress.      Breath sounds: No wheezing, rhonchi or rales.   Abdominal:      General: Abdomen is flat.      Palpations: Abdomen is soft.      Tenderness: There is generalized abdominal tenderness. There is no right CVA tenderness, left CVA tenderness, guarding or rebound.   Skin:  General: Skin is warm and dry.      Findings: No erythema or rash.   Neurological:      General: No focal deficit present.      Mental Status: She is alert and oriented to person, place, and time.   Psychiatric:         Mood and Affect: Mood normal.         Behavior: Behavior normal.           MDM and ED Course     ED Medication Orders (From admission, onward)      Start Ordered     Status Ordering Provider    05/15/22 1539 05/15/22 1538  sodium chloride 0.9 % bolus 1,000 mL  Once        Route: Intravenous  Ordered Dose: 1,000 mL       Ordered Elycia Woodside, [place] R    05/15/22 1538 05/15/22 1537  piperacillin-tazobactam (ZOSYN) 4.5 g in sterile water (preservative free) 20 mL IV push injection  Once        Route: Intravenous  Ordered Dose: 4.5 g       Ordered Dian Laprade, [place] R    05/15/22 1535 05/15/22 1534  morphine injection 4 mg  Once        Route: Intravenous  Ordered Dose: 4 mg       Acknowledged Eldredge Veldhuizen,  [place] R    05/15/22 1535 05/15/22 1534  metoclopramide (REGLAN) injection 10 mg  Once        Route: Intravenous  Ordered Dose: 10 mg       Acknowledged Belita Warsame, [place] R    05/15/22 1505 05/15/22 1504  ondansetron (ZOFRAN) injection 4 mg  Once        Route: Intravenous  Ordered Dose: 4 mg       Last MAR action: Given SHAH, AMAN A               Medical Decision Making  Alisha Lloyd is a 51 y.o. female with surgical history of prior cholecystectomy who presents with almost 24 hours of generalized abdominal pain and associated nausea and nonbloody vomiting.  On examination, patient looks moderately uncomfortable related to nausea and vomiting.  She has a soft and nondistended abdomen on exam with no signs of peritonitis and no clear focal tenderness that was just mildly tender throughout.  She has no CVA tenderness.  Patient's vital significant for sinus tachycardia around 108, otherwise normal.  She is afebrile here.  Concern for gastritis or enteritis versus pancreatitis versus appendicitis or bowel obstruction.  Plan for CBC, electrolytes, LFTs, lipase, urinalysis, CT of the abdomen and pelvis, hCG.    Amount and/or Complexity of Data Reviewed  Labs: ordered.  Radiology: ordered.  ECG/medicine tests: ordered.    Risk  Prescription drug management.      7:43 PM  -CT abdomen unremarkable, no clear source of nausea and vomiting found.  Patient's symptoms have resolved.  She feels well.  She was able to take p.o. without difficulty.  We provided her additional IV fluids and her hemoconcentrated CBC improved on repeat and lactic went down.  After discussion with patient, she is comfortable with going home and following up with primary care doctor within the next 3 days and returning for worsening symptoms.               Procedures    Clinical Impression & Disposition     Clinical Impression  Final diagnoses:   None  ED Disposition       None             New Prescriptions    No medications on file                    Vestal Markin, [place] R, MD  Resident  05/15/22 1622       Derek Huneycutt, [place] R, MD  Resident  05/15/22 [phone]

## 2022-05-15 NOTE — ED Provider Notes (Shared)
[facility]                                             ATTENDING SUPERVISORY NOTE      Visit date: 05/15/2022    CLINICAL SUMMARY        Diagnosis:    .     Final diagnoses:   None         Attending Note:    Alisha Lloyd is Lloyd 51 y.o. female w/Shx cholecystectomy who presents with worsening abdominal pain (initially RLQ but now generalized) and associated nausea and  non-bloody vomiting x1 day. Endorses some chills, and subjective tingling in tips of fingers and toes after heavy bouts of vomiting today.  No fever, changes in stool or dysuria, flank pain.   Medical Decision Making    {BADMDMPHRASES:59167}         Disposition:      {~~Select at end of visit~~:45609}            VISIT INFORMATION        Clinical Course in the ED:                   Medications Given in the ED:    .     ED Medication Orders (From admission, onward)      Start Ordered     Status Ordering Provider    05/15/22 1539 05/15/22 1538  sodium chloride 0.9 % bolus 1,000 mL  Once        Route: Intravenous  Ordered Dose: 1,000 mL       Acknowledged Alisha Lloyd    05/15/22 1538 05/15/22 1537  piperacillin-tazobactam (ZOSYN) 4.5 g in sterile water (preservative free) 20 mL IV push injection  Once        Route: Intravenous  Ordered Dose: 4.5 g       Acknowledged Alisha Lloyd    05/15/22 1535 05/15/22 1534  morphine injection 4 mg  Once        Route: Intravenous  Ordered Dose: 4 mg       Last MAR action: Given Alisha Lloyd    05/15/22 1535 05/15/22 1534  metoclopramide (REGLAN) injection 10 mg  Once        Route: Intravenous  Ordered Dose: 10 mg       Last MAR action: Given Alisha Lloyd    05/15/22 1505 05/15/22 1504  ondansetron (ZOFRAN) injection 4 mg  Once        Route: Intravenous  Ordered Dose: 4 mg       Last MAR action: Given Alisha Lloyd              Procedures:      I was present during key portions of any procedures performed.      Interpretations:      O2 Sat:  The  patient's oxygen saturation was 96 % on room air. This was independently interpreted by me as Normal.   EKG: I reviewed and Independently interpreted the patient's EKG as normal sinus at 100. No acute ST or T-wave changes, normal axis.               PAST HISTORY        Primary Care Provider: Pcp, None, MD        PMH/PSH:    .  History reviewed. No pertinent past medical history.              Social/Family History:      She reports that she has never smoked. She has never used smokeless tobacco. She reports current alcohol use. She reports that she does not use drugs.    History reviewed. No pertinent family history.      Listed Medications on Arrival:    .     Home Medications    None on File        Allergies: She has No Known Allergies.            RESULTS        Lab Results:      Results       Procedure Component Value Units Date/Time    Lactic Acid [id number] Collected: 05/15/22 1612    Specimen: Blood Updated: 05/15/22 1626    Narrative:      No tourniquet;on ice    Hepatic function panel (LFT) [id number] Collected: 05/15/22 1612    Specimen: Blood Updated: 05/15/22 1625    Lipase [id number] Collected: 05/15/22 1612    Specimen: Blood Updated: 05/15/22 1625    i-stat Whole Blood B-HCG, Total [id number] Collected: 05/15/22 1609     Updated: 05/15/22 1621     i-stat Whole Blood B-HCG, Total <5.0 IU/L     Culture Blood Aerobic and Anaerobic [id number] Collected: 05/15/22 1612    Specimen: Arm from Blood, Venipuncture Updated: 05/15/22 1612    Narrative:      1 BLUE+1 PURPLE    Basic Metabolic Panel [id number]  (Abnormal) Collected: 05/15/22 1502    Specimen: Blood Updated: 05/15/22 1528     Glucose 227 mg/dL      BUN 10.0 mg/dL      Creatinine 0.9 mg/dL      Calcium 10.9 mg/dL      Sodium 136 mEq/L      Potassium 3.7 mEq/L      Chloride 98 mEq/L      CO2 24 mEq/L      Anion Gap 14.0     eGFR >60.0 mL/min/1.73 m2     Magnesium [id number] Collected: 05/15/22 1502    Specimen: Blood Updated: 05/15/22 1528      Magnesium 2.3 mg/dL     CBC and differential [id number]  (Abnormal) Collected: 05/15/22 1502    Specimen: Blood Updated: 05/15/22 1517     WBC 20.91 x10 3/uL      Hgb 17.9 g/dL      Hematocrit 50.7 %      Platelets 611 x10 3/uL      RBC 6.07 x10 6/uL      MCV 83.5 fL      MCH 29.5 pg      MCHC 35.3 g/dL      RDW 12 %      MPV 9.2 fL      Instrument Absolute Neutrophil Count 17.07 x10 3/uL      Neutrophils 81.7 %      Lymphocytes Automated 11.3 %      Monocytes 6.4 %      Eosinophils Automated 0.0 %      Basophils Automated 0.3 %      Immature Granulocytes 0.3 %      Nucleated RBC 0.0 /100 WBC      Neutrophils Absolute 17.07 x10 3/uL      Lymphocytes Absolute Automated 2.36 x10 3/uL      Monocytes Absolute Automated 1.33 x10  3/uL      Eosinophils Absolute Automated 0.01 x10 3/uL      Basophils Absolute Automated 0.07 x10 3/uL      Immature Granulocytes Absolute 0.07 x10 3/uL      Absolute NRBC 0.00 x10 3/uL                 Radiology Results:      CT Abd/ Pelvis with IV Contrast    (Results Pending)               Supervisory Statements:        I have reviewed and agree with the history except as noted above. The pertinent physical exam has been documented.  I have reviewed and agree with the final ED diagnosis.      Scribe Attestation:      I was acting as Lloyd Education administrator for Alisha Fiddler, MD on Alisha Lloyd  Treatment Team: Scribe: Alisha Lloyd    I am the first provider for this patient and I personally performed the services documented. Treatment Team: Scribe: Alisha Lloyd is scribing for me on Alisha Lloyd. This note accurately reflects work and decisions made by me.  Alisha Fiddler, MD

## 2022-05-15 NOTE — Discharge Instructions (Signed)
Dear Alisha Lloyd:    Thank you for choosing the [facility] Emergency Department, the premier emergency department in the [place] area.  I hope your visit today was EXCELLENT. You will receive a survey via text message that will give you the opportunity to provide feedback to your team about your visit. Please do not hesitate to reach out with any questions!    Specific instructions for your visit today:    Please take Zofran up to every 8 hours as needed for nausea and vomiting.  If you are still vomiting despite this or cannot keep food down or having worsening pain or fevers come to the emergency department again immediately.  Additionally, while here you had an elevated blood sugar level.  This may indicate that you are developing diabetes and we recommend that you follow-up with your primary care doctor for repeat blood sugars or other diabetes related testing.    IF YOU DO NOT CONTINUE TO IMPROVE OR YOUR CONDITION WORSENS, PLEASE CONTACT YOUR DOCTOR OR RETURN IMMEDIATELY TO THE EMERGENCY DEPARTMENT.    Sincerely,  Salameh, Elvin So, MD  Attending Emergency Physician  [facility] Emergency [facility]      OBTAINING A PRIMARY CARE APPOINTMENT    Primary care physicians (PCPs, also known as primary care doctors) are either internists or family medicine doctors. Both types of PCPs focus on health promotion, disease prevention, patient education and counseling, and treatment of acute and chronic medical conditions.    If you need a primary care doctor, please call the below number and ask who is receiving new patients.     [facility]  Telephone:  [phone]  [email or web address]    DOCTOR REFERRALS  Call [phone] (available 24 hours a day, 7 days a week) if you need any further referrals and we can help you find a primary care doctor or specialist.  Also, available online at:  [email or web address]    YOUR CONTACT INFORMATION  Before leaving please check with  registration to make sure we have an up-to-date contact number.  You can call registration at [phone] to update your information.  For questions about your hospital bill, please call [phone].  For questions about your Emergency Dept Physician bill please call [phone].      [place]  If you need help with health or social services, please call 2-1-1 for a free referral to resources in your area.  2-1-1 is a free service connecting people with information on health insurance, free clinics, pregnancy, mental health, dental care, food assistance, housing, and substance abuse counseling.  Also, available online at:  http://www.[place].org    ORTHOPEDIC INJURY   Please know that significant injuries can exist even when an initial x-ray is read as normal or negative.  This can occur because some fractures (broken bones) are not initially visible on x-rays.  For this reason, close outpatient follow-up with your primary care doctor or bone specialist (orthopedist) is required.    MEDICATIONS AND FOLLOWUP  Please be aware that some prescription medications can cause drowsiness.  Use caution when driving or operating machinery.    The examination and treatment you have received in our Emergency Department is provided on an emergency basis, and is not intended to be a substitute for your primary care physician.  It is important that your doctor checks you again and that you report any new or remaining problems at that time.      ASSISTANCE WITH INSURANCE  Affordable Care Act  (ACA)  Call to start or finish an application, compare plans, enroll or ask a question.  [place]: [phone]  Web:  Healthcare.gov    Help Enrolling in [place]  7784126745 (TOLL-FREE)  803-654-6719 (TTY)  Web:  Http://www.coverva.org    Local Help Enrolling in the [place]  (732) 656-2520 (MAIN)  Email:  health-help@nvfs .org  Web:   http://lewis-perez.info/  Address:  [address] [place], [place] [address]    SEDATING MEDICATIONS  Sedating medications include strong pain medications (e.g. narcotics), muscle relaxers, benzodiazepines (used for anxiety and as muscle relaxers), Benadryl/diphenhydramine and other antihistamines for allergic reactions/itching, and other medications.  If you are unsure if you have received a sedating medication, please ask your physician or nurse.  If you received a sedating medication: DO NOT drive a car. DO NOT operate machinery. DO NOT perform jobs where you need to be alert.  DO NOT drink alcoholic beverages while taking this medicine.     If you get dizzy, sit or lie down at the first signs. Be careful going up and down stairs.  Be extra careful to prevent falls.     Never give this medicine to others.     Keep this medicine out of reach of children.     Do not take or save old medicines. Throw them away when outdated.     Keep all medicines in a cool, dry place. DO NOT keep them in your bathroom medicine cabinet or in a cabinet above the stove.    MEDICATION REFILLS  Please be aware that we cannot refill any prescriptions through the ER. If you need further treatment from what is provided at your ER visit, please follow up with your primary care doctor or your pain management specialist.    [place]  Did you know [facility] has two freestanding ERs located just a few miles away?  [facility] [place] [facility] [place] have short wait times, easy free parking directly in front of the building and top patient satisfaction scores - and the same Board Certified Emergency Medicine doctors as [facility].

## 2024-01-17 IMAGING — CT CT ABD-PELV W/ CM
2 of 5 series · 16 of 46 positions shown, 18 images · IV contrast (APPLIED)
Comparison: CT abdomen and pelvis 11/29/2012

CLINICAL DATA: Nausea and vomiting

EXAM:
CT ABDOMEN AND PELVIS WITH CONTRAST
TECHNIQUE: Multidetector CT imaging of the abdomen and pelvis was performed
using the standard protocol following bolus administration of
intravenous contrast.

[Series 2: abdomen 5.0 · axial · 0.79mm/px · z∈[-1160,-735]mm · 13 of 99 slices shown, 15 images]
[im 7/99  soft-tissue]
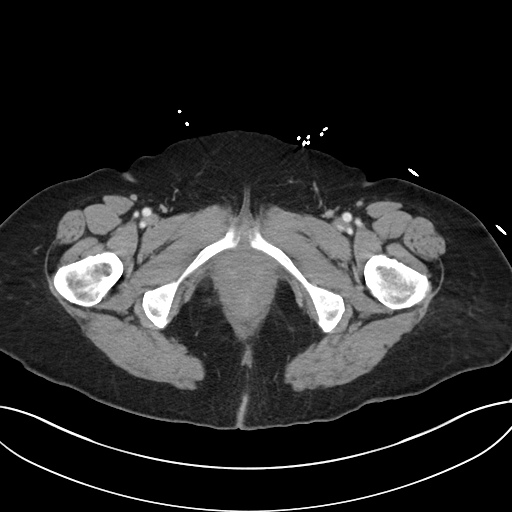
[im 7/99  bone]
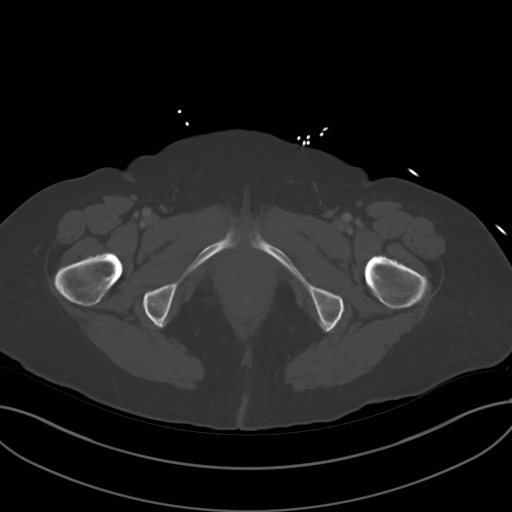
[im 14/99  soft-tissue]
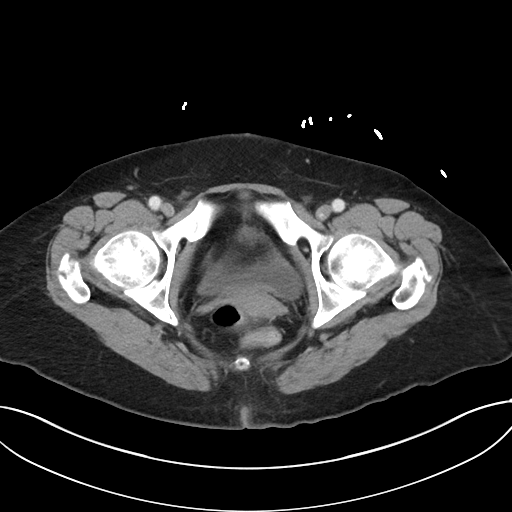
[im 20/99  soft-tissue]
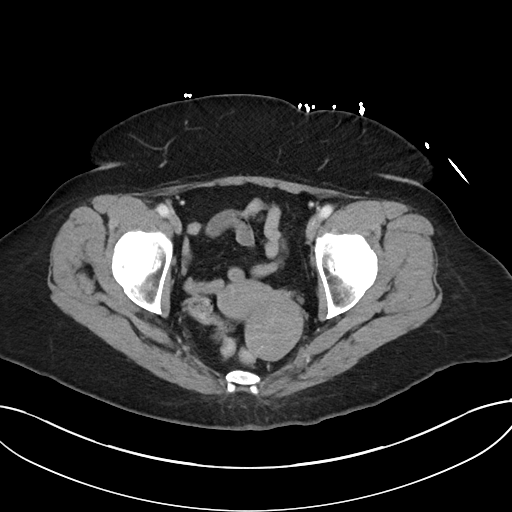
[im 27/99  soft-tissue]
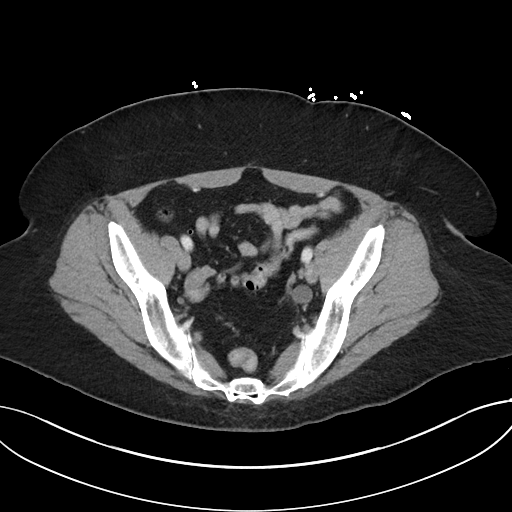
[im 33/99  soft-tissue]
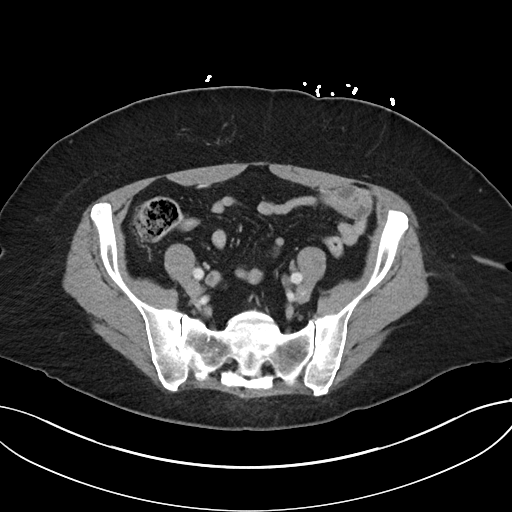
[im 40/99  soft-tissue]
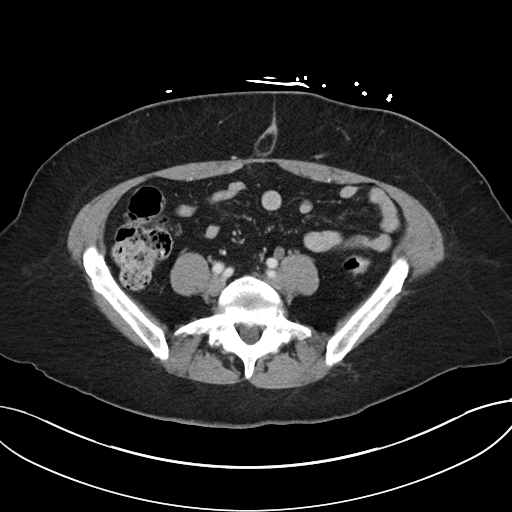
[im 53/99  soft-tissue]
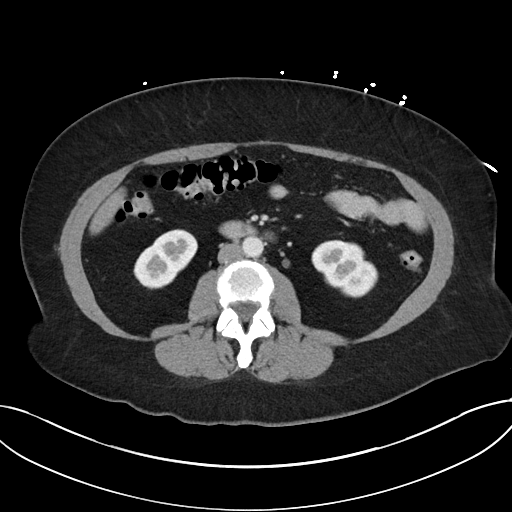
[im 59/99  soft-tissue]
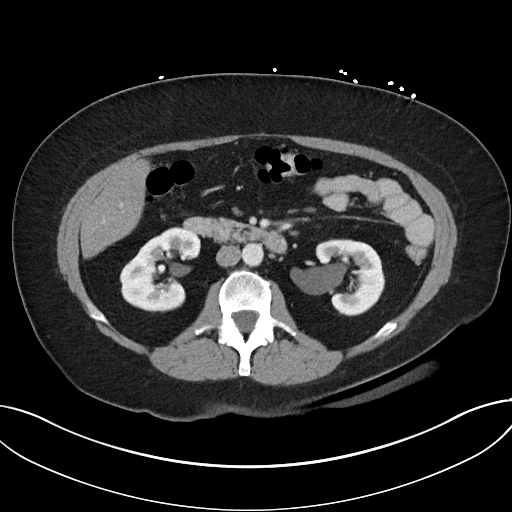
[im 66/99  soft-tissue]
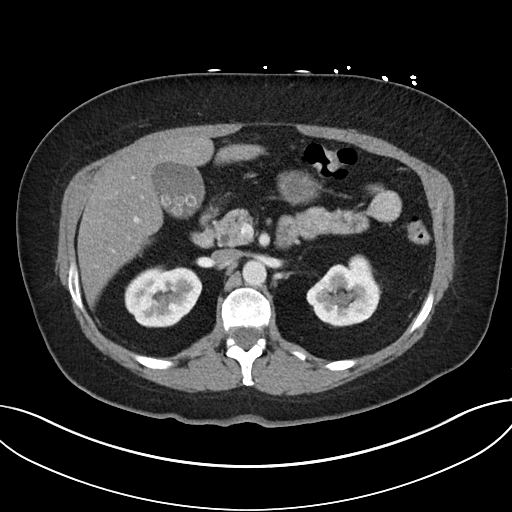
[im 66/99  bone]
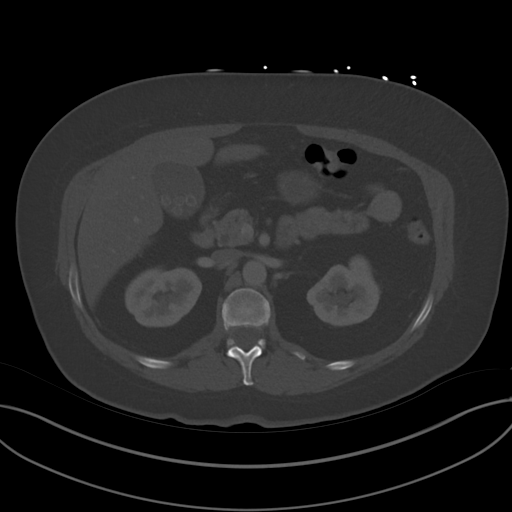
[im 72/99  soft-tissue]
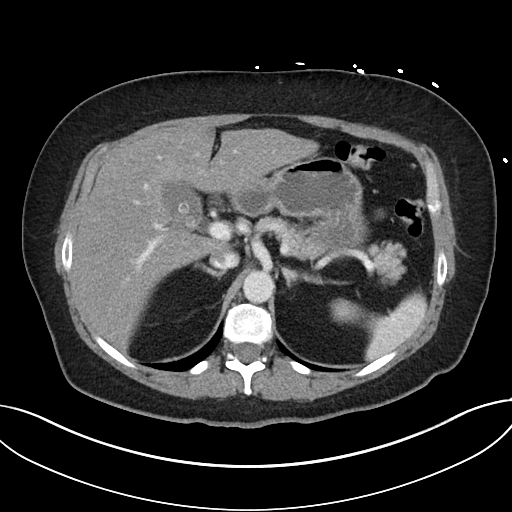
[im 79/99  soft-tissue]
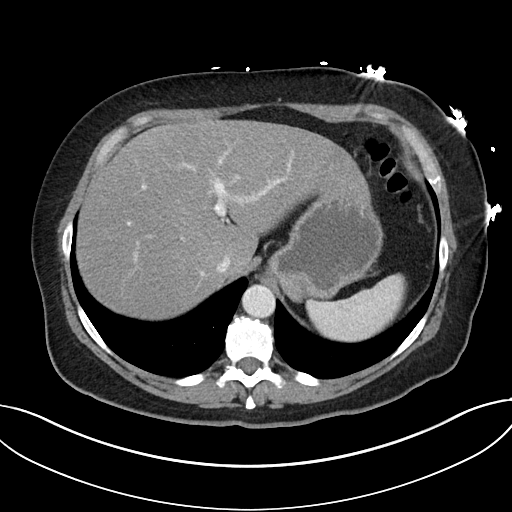
[im 85/99  soft-tissue]
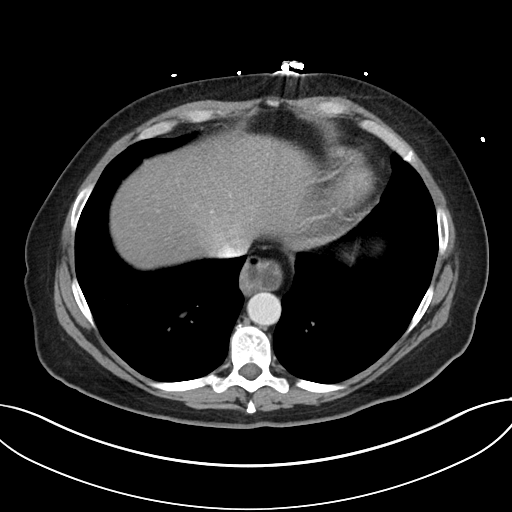
[im 92/99  soft-tissue]
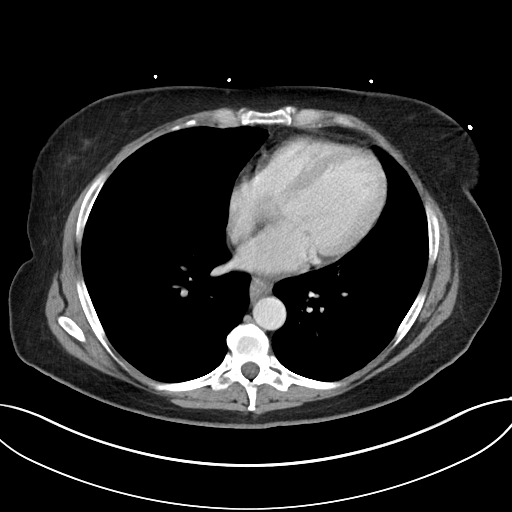

[Series 5: abdomen 3.0 mpr cor · coronal · 0.92mm/px · 3 of 101 slices shown]
[im 34/101  soft-tissue]
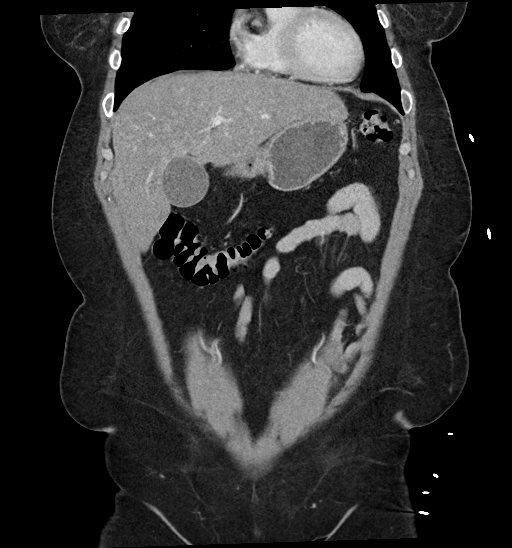
[im 45/101  soft-tissue]
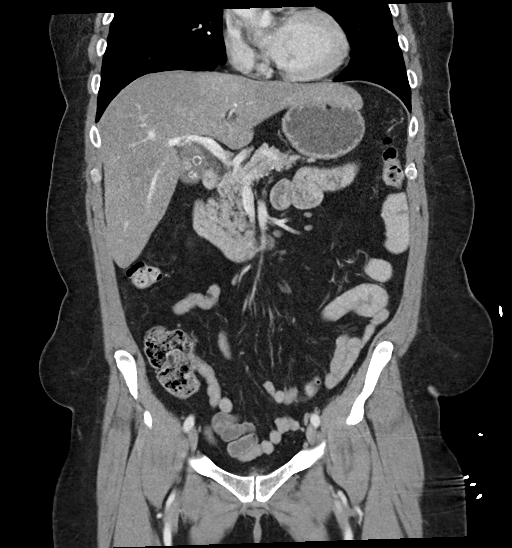
[im 56/101  soft-tissue]
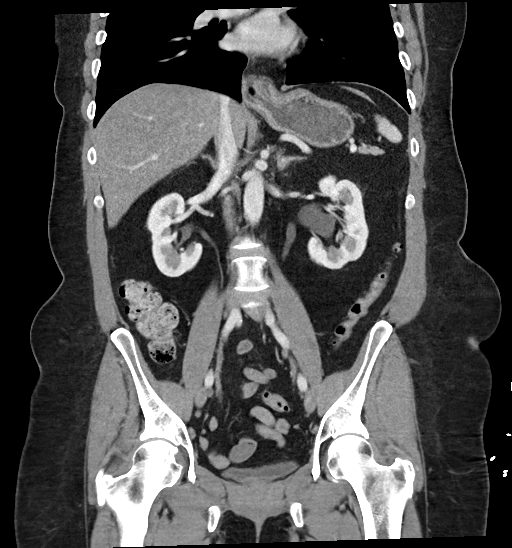

[16 of 46 positions shown; findings below may reference images not displayed]

RADIATION DOSE REDUCTION: This exam was performed according to the
departmental dose-optimization program which includes automated
exposure control, adjustment of the mA and/or kV according to
patient size and/or use of iterative reconstruction technique.

CONTRAST:  65mL OMNIPAQUE IOHEXOL 300 MG/ML  SOLN
FINDINGS: Lower chest: No acute abnormality.

Hepatobiliary: Liver is upper normal size with normal contour.
Hypodense appearance of the parenchyma suggesting hepatic steatosis.
No suspicious mass identified. Multiple stones in the gallbladder
with no wall thickening or pericholecystic edema. Common bile duct
is mildly dilated measuring 7 mm in diameter, similar to previous
study.

Pancreas: Unremarkable. No pancreatic ductal dilatation or
surrounding inflammatory changes.

Spleen: Normal in size without focal abnormality.

Adrenals/Urinary Tract: Adrenal glands appear normal. Mild left
hydroureteronephrosis with no obstructing calculus identified. There
is transition in caliber of the distal ureter a few cm above the
UVJ, similar to previous study. Symmetric perfusion of the kidneys.
No suspicious renal mass identified. Urinary bladder appears normal.

Stomach/Bowel: Small hiatal hernia. No bowel obstruction, free air
or pneumatosis. Colonic diverticulosis. No bowel wall edema
identified. Appendix is normal.

Vascular/Lymphatic: No significant vascular findings are present. No
enlarged abdominal or pelvic lymph nodes.

Reproductive: Lobulated uterus with likely fibroids measuring up to
4.8 x 4.8 cm at the fundus on the left.

Other: No ascites.

Musculoskeletal: No acute or significant osseous findings.
IMPRESSION: 1. Cholelithiasis and mild dilatation of the common bile duct,
similar to previous study. Correlate clinically for possible MRCP
indication.
2. Hepatic steatosis.
3. Small hiatal hernia.
4. Colonic diverticulosis.
5. Chronic mild left hydroureteronephrosis with similar-appearing
transition in caliber of the distal ureter, possibly a ureteral
stricture/stenosis.
6. Fibroid uterus.
